# Patient Record
Sex: Female | Born: 1960
Health system: Southern US, Community
[De-identification: ages and names within clinical notes are randomized; demographics above are authoritative.]

## PROBLEM LIST (undated history)

## (undated) DIAGNOSIS — F988 Other specified behavioral and emotional disorders with onset usually occurring in childhood and adolescence: Secondary | ICD-10-CM

## (undated) DIAGNOSIS — G2581 Restless legs syndrome: Secondary | ICD-10-CM

## (undated) DIAGNOSIS — F5104 Psychophysiologic insomnia: Secondary | ICD-10-CM

## (undated) DIAGNOSIS — K219 Gastro-esophageal reflux disease without esophagitis: Secondary | ICD-10-CM

## (undated) DIAGNOSIS — R55 Syncope and collapse: Secondary | ICD-10-CM

## (undated) DIAGNOSIS — F32A Depression, unspecified: Secondary | ICD-10-CM

## (undated) DIAGNOSIS — E785 Hyperlipidemia, unspecified: Secondary | ICD-10-CM

## (undated) DIAGNOSIS — F329 Major depressive disorder, single episode, unspecified: Secondary | ICD-10-CM

## (undated) DIAGNOSIS — R109 Unspecified abdominal pain: Secondary | ICD-10-CM

## (undated) DIAGNOSIS — R197 Diarrhea, unspecified: Secondary | ICD-10-CM

## (undated) DIAGNOSIS — C801 Malignant (primary) neoplasm, unspecified: Secondary | ICD-10-CM

## (undated) DIAGNOSIS — I879 Disorder of vein, unspecified: Secondary | ICD-10-CM

## (undated) HISTORY — DX: Gastro-esophageal reflux disease without esophagitis: K21.9

## (undated) HISTORY — DX: Hyperlipidemia, unspecified: E78.5

## (undated) HISTORY — DX: Psychophysiologic insomnia: F51.04

## (undated) HISTORY — DX: Restless legs syndrome: G25.81

## (undated) HISTORY — DX: Malignant (primary) neoplasm, unspecified: C80.1

## (undated) HISTORY — DX: Depression, unspecified: F32.A

## (undated) HISTORY — DX: Major depressive disorder, single episode, unspecified: F32.9

## (undated) HISTORY — DX: Syncope and collapse: R55

## (undated) HISTORY — DX: Disorder of vein, unspecified: I87.9

## (undated) HISTORY — DX: Other specified behavioral and emotional disorders with onset usually occurring in childhood and adolescence: F98.8

## (undated) HISTORY — PX: APPENDECTOMY: SHX54

## (undated) HISTORY — DX: Diarrhea, unspecified: R19.7

## (undated) HISTORY — DX: Unspecified abdominal pain: R10.9

---

## 1998-02-02 ENCOUNTER — Other Ambulatory Visit: Admission: RE | Admit: 1998-02-02 | Discharge: 1998-02-02 | Payer: Self-pay | Admitting: *Deleted

## 1998-08-29 ENCOUNTER — Inpatient Hospital Stay (HOSPITAL_COMMUNITY): Admission: AD | Admit: 1998-08-29 | Discharge: 1998-09-01 | Payer: Self-pay | Admitting: Obstetrics and Gynecology

## 1998-10-06 ENCOUNTER — Other Ambulatory Visit: Admission: RE | Admit: 1998-10-06 | Discharge: 1998-10-06 | Payer: Self-pay | Admitting: Obstetrics and Gynecology

## 2010-11-10 ENCOUNTER — Inpatient Hospital Stay (HOSPITAL_COMMUNITY)
Admission: AD | Admit: 2010-11-10 | Discharge: 2010-11-15 | DRG: 371 | Disposition: A | Discharge status: Home / Self Care | Payer: Medicaid Other | Source: Other Acute Inpatient Hospital | Attending: Vascular Surgery | Admitting: Vascular Surgery

## 2010-11-10 DIAGNOSIS — T8140XA Infection following a procedure, unspecified, initial encounter: Secondary | ICD-10-CM | POA: Diagnosis present

## 2010-11-10 DIAGNOSIS — Y836 Removal of other organ (partial) (total) as the cause of abnormal reaction of the patient, or of later complication, without mention of misadventure at the time of the procedure: Secondary | ICD-10-CM | POA: Diagnosis present

## 2010-11-10 DIAGNOSIS — K651 Peritoneal abscess: Secondary | ICD-10-CM

## 2010-11-10 DIAGNOSIS — R109 Unspecified abdominal pain: Secondary | ICD-10-CM

## 2010-11-10 DIAGNOSIS — F3289 Other specified depressive episodes: Secondary | ICD-10-CM | POA: Diagnosis present

## 2010-11-10 DIAGNOSIS — F329 Major depressive disorder, single episode, unspecified: Secondary | ICD-10-CM | POA: Diagnosis present

## 2010-11-10 DIAGNOSIS — K55059 Acute (reversible) ischemia of intestine, part and extent unspecified: Secondary | ICD-10-CM | POA: Diagnosis present

## 2010-11-10 DIAGNOSIS — K219 Gastro-esophageal reflux disease without esophagitis: Secondary | ICD-10-CM | POA: Diagnosis present

## 2010-11-10 DIAGNOSIS — E669 Obesity, unspecified: Secondary | ICD-10-CM | POA: Diagnosis present

## 2010-11-10 DIAGNOSIS — I748 Embolism and thrombosis of other arteries: Secondary | ICD-10-CM

## 2010-11-11 ENCOUNTER — Inpatient Hospital Stay (HOSPITAL_COMMUNITY): Payer: Medicaid Other

## 2010-11-11 DIAGNOSIS — K551 Chronic vascular disorders of intestine: Secondary | ICD-10-CM

## 2010-11-11 LAB — BASIC METABOLIC PANEL
BUN: 8 mg/dL (ref 6–23)
CO2: 26 mEq/L (ref 19–32)
Calcium: 8 mg/dL — ABNORMAL LOW (ref 8.4–10.5)
Chloride: 102 mEq/L (ref 96–112)
Creatinine, Ser: 0.67 mg/dL (ref 0.50–1.10)
GFR calc Af Amer: >60 mL/min (ref >60)
GFR calc non Af Amer: >60 mL/min (ref >60)
Glucose, Bld: 97 mg/dL (ref 70–99)
Potassium: 3.9 mEq/L (ref 3.5–5.1)
Sodium: 134 mEq/L — ABNORMAL LOW (ref 135–145)

## 2010-11-11 LAB — HOMOCYSTEINE: Homocysteine: 5.8 umol/L (ref 4.0–15.4)

## 2010-11-11 LAB — LACTIC ACID, PLASMA: Lactic Acid, Venous: 1.2 mmol/L (ref 0.5–2.2)

## 2010-11-11 LAB — APTT: aPTT: 58 seconds — ABNORMAL HIGH (ref 24–37)

## 2010-11-11 LAB — CBC
MCH: 30.4 pg (ref 26.0–34.0)
MCV: 89.2 fL (ref 78.0–100.0)
Platelets: 240 10*3/uL (ref 150–400)
RBC: 3.81 MIL/uL — ABNORMAL LOW (ref 3.87–5.11)
RDW: 14.5 % (ref 11.5–15.5)
WBC: 11.7 10*3/uL — ABNORMAL HIGH (ref 4.0–10.5)

## 2010-11-11 LAB — HEPARIN LEVEL (UNFRACTIONATED): Heparin Unfractionated: 0.22 IU/mL — ABNORMAL LOW (ref 0.30–0.70)

## 2010-11-11 LAB — PROTIME-INR
INR: 0.99 (ref 0.00–1.49)
Prothrombin Time: 13.3 seconds (ref 11.6–15.2)

## 2010-11-12 DIAGNOSIS — K551 Chronic vascular disorders of intestine: Secondary | ICD-10-CM

## 2010-11-12 LAB — CBC
HCT: 33.3 % — ABNORMAL LOW (ref 36.0–46.0)
Hemoglobin: 11.1 g/dL — ABNORMAL LOW (ref 12.0–15.0)
MCV: 90.2 fL (ref 78.0–100.0)
RBC: 3.69 MIL/uL — ABNORMAL LOW (ref 3.87–5.11)
WBC: 9.1 10*3/uL (ref 4.0–10.5)

## 2010-11-12 LAB — HEPARIN LEVEL (UNFRACTIONATED): Heparin Unfractionated: 0.26 IU/mL — ABNORMAL LOW (ref 0.30–0.70)

## 2010-11-12 LAB — BASIC METABOLIC PANEL
BUN: 6 mg/dL (ref 6–23)
CO2: 26 mEq/L (ref 19–32)
Chloride: 101 mEq/L (ref 96–112)
Creatinine, Ser: 0.67 mg/dL (ref 0.50–1.10)
GFR calc Af Amer: >60 mL/min (ref >60)
Glucose, Bld: 92 mg/dL (ref 70–99)
Potassium: 3.9 mEq/L (ref 3.5–5.1)

## 2010-11-12 LAB — LUPUS ANTICOAGULANT PANEL
DRVVT: 43 secs (ref 36.2–44.3)
Lupus Anticoagulant: DETECTED — AB
PTTLA 4:1 Mix: 51.8 secs — ABNORMAL HIGH (ref 30.0–45.6)

## 2010-11-12 LAB — PROTEIN S, TOTAL: Protein S Ag, Total: 95 % (ref 60–150)

## 2010-11-12 LAB — PROTEIN C ACTIVITY: Protein C Activity: 116 % (ref 75–133)

## 2010-11-13 DIAGNOSIS — K551 Chronic vascular disorders of intestine: Secondary | ICD-10-CM

## 2010-11-13 LAB — CBC
Hemoglobin: 10.9 g/dL — ABNORMAL LOW (ref 12.0–15.0)
MCH: 29.9 pg (ref 26.0–34.0)
MCHC: 33.3 g/dL (ref 30.0–36.0)
MCV: 89.8 fL (ref 78.0–100.0)
Platelets: 242 10*3/uL (ref 150–400)
RBC: 3.64 MIL/uL — ABNORMAL LOW (ref 3.87–5.11)

## 2010-11-13 LAB — HEPARIN LEVEL (UNFRACTIONATED): Heparin Unfractionated: 0.28 IU/mL — ABNORMAL LOW (ref 0.30–0.70)

## 2010-11-13 LAB — PROTIME-INR: Prothrombin Time: 13.5 seconds (ref 11.6–15.2)

## 2010-11-14 DIAGNOSIS — K551 Chronic vascular disorders of intestine: Secondary | ICD-10-CM

## 2010-11-14 LAB — CBC
Hemoglobin: 11.4 g/dL — ABNORMAL LOW (ref 12.0–15.0)
MCH: 29.5 pg (ref 26.0–34.0)
MCHC: 33.2 g/dL (ref 30.0–36.0)
MCV: 88.6 fL (ref 78.0–100.0)
Platelets: 287 10*3/uL (ref 150–400)
RBC: 3.87 MIL/uL (ref 3.87–5.11)

## 2010-11-14 LAB — HEPARIN LEVEL (UNFRACTIONATED): Heparin Unfractionated: 0.3 IU/mL (ref 0.30–0.70)

## 2010-11-15 DIAGNOSIS — K551 Chronic vascular disorders of intestine: Secondary | ICD-10-CM

## 2010-11-15 LAB — CBC
Hemoglobin: 12.1 g/dL (ref 12.0–15.0)
MCH: 29.8 pg (ref 26.0–34.0)
Platelets: 299 10*3/uL (ref 150–400)
RBC: 4.06 MIL/uL (ref 3.87–5.11)
WBC: 6.3 10*3/uL (ref 4.0–10.5)

## 2010-11-15 LAB — CULTURE, ROUTINE-ABSCESS: Culture: NO GROWTH

## 2010-11-15 LAB — PROTIME-INR: Prothrombin Time: 26.4 seconds — ABNORMAL HIGH (ref 11.6–15.2)

## 2010-11-15 LAB — HEPARIN LEVEL (UNFRACTIONATED): Heparin Unfractionated: 0.39 IU/mL (ref 0.30–0.70)

## 2010-11-15 LAB — PROTHROMBIN GENE MUTATION

## 2010-11-16 LAB — CARDIOLIPIN ANTIBODIES, IGG, IGM, IGA: Anticardiolipin IgG: 7 GPL U/mL — ABNORMAL LOW (ref <23)

## 2010-11-16 LAB — BETA-2-GLYCOPROTEIN I ABS, IGG/M/A
Beta-2-Glycoprotein I IgA: 2 A Units (ref <20)
Beta-2-Glycoprotein I IgM: 2 M Units (ref <20)

## 2010-11-16 NOTE — Consult Note (Signed)
Rebecca Caldwell, Rebecca Caldwell NO.:  [id number]  MEDICAL RECORD NO.:  [id number]  LOCATION:  2311                         FACILITY:  [facility]  PHYSICIAN:  Abigail Miyamoto, M.D. DATE OF BIRTH:  [birth year]  DATE OF CONSULTATION:  11/10/2010 DATE OF DISCHARGE:                                CONSULTATION   CHIEF COMPLAINT:  Abdominal pain.  REFERRING PHYSICIAN:  Janetta Hora. Fields, MD  HISTORY:  This is a 50 year old female who is status post a laparoscopic appendectomy performed in [place] at [place] approximately 12 days ago.  She now started having increasing abdominal pain and pain in her back and presented to [facility].  At [place], a CAT scan of her abdomen and pelvis was performed which showed a fluid collection of right lower quadrant as well as a superior mesenteric vein thrombosis. She is being admitted by Vascular Surgery for anticoagulation here at [facility].  I have been asked to see her, address her fluid collection as well as to evaluate for any evidence of ischemic bowel.  She has had no nausea or vomiting.  She reports she had normal bowel movement today. She denies any fevers or chills.  Her pain is moderate and again crampy in nature.  PAST MEDICAL HISTORY:  Negative.  PAST SURGICAL HISTORY:  Above-mentioned appendectomy as well as C- section.  MEDICATIONS:  Please see universal medication form.  ALLERGIES:  No known drug allergies.  SOCIAL HISTORY:  She does not smoke, does not drink alcohol.  FAMILY HISTORY:  Negative for clotting disorders and is positive for diabetes, hypertension, lymphoma, and thyroid problems.  REVIEW OF SYSTEMS:  GENERAL:  Negative fever or chills.  PULMONARY: Negative for cough, shortness or breath, or difficulty breathing. CARDIAC:  Negative for chest pain or irregular heartbeat.  GI:  Listed as above.  URINARY:  Negative for dysuria or hematuria.  The rest of review of systems including skin, eyes, ears,  nose, and throat, musculoskeletal, neurologic, psychiatric, endocrine are normal.  PHYSICAL EXAMINATION:  GENERAL:  Well-developed, well-nourished female who is mildly uncomfortable, but in no acute distress. VITAL SIGNS:  Heart rate 78, respiratory rate 13, blood pressure 124/68, she is sating 97% on room air, she is afebrile. HEENT:  Eyes, anicteric.  Pupils are reactive bilaterally.  ENT, external ears and nose are normal.  Hearing is normal.  Oropharynx is clear. NECK:  Supple.  Trachea is midline.  There is no thyromegaly. LUNGS:  Clear to auscultation bilaterally with normal respiratory effort. CARDIOVASCULAR:  Regular rate and rhythm.  There are no murmurs.  There is no peripheral edema. ABDOMEN:  Soft.  There is mild diffuse tenderness with no frank peritonitis.  Her incisions are well healed.  There are no palpable masses.  There is no organomegaly. EXTREMITIES:  Warm and well perfused.  No edema, clubbing, or cyanosis. Peripheral pulses are intact to all 4 extremities. SKIN:  No rash and no jaundice. PSYCHIATRIC:  Awake and alert and oriented.  Judgment and affect appear normal.  DATA REVIEWED:  I reviewed the CAT scan of the abdomen and pelvis that shows a 4-cm collection with air in it in the right lower quadrant in  the retrocecal location.  There is also SMV thrombosis.  There is no bowel wall thickening.  There is no free air and no free fluid. Laboratory data from [place] shows her to have an elevated white blood count of 11.1.  There was a slight left shift.  IMPRESSION:  This is a 50 year old female with a postoperative intra- abdominal abscess after a laparoscopic appendectomy as well as superior mesenteric vein thrombosis.  Dr. Darrick Penna said that we will start [name] on IV heparin regarding thrombosis.  Currently, there is no evidence of bowel ischemia.  We will follow up with her abdominal exam closely.  Regarding the collection in the right lower quadrant,  I do suspect this is an abscess after discussion with the radiologist.  We will be scheduling her to get a consultation from interventional radiologist for a percutaneous drainage of this collection.  I am going to also start her on IV antibiotics.  We will follow her closely with you.     Abigail Miyamoto, M.D.     DB/MEDQ  D:  11/10/2010  T:  11/11/2010  Job:  [id number]  Electronically Signed by Abigail Miyamoto M.D. on 11/16/2010 03:36:10 PM

## 2010-11-24 ENCOUNTER — Ambulatory Visit (INDEPENDENT_AMBULATORY_CARE_PROVIDER_SITE_OTHER): Payer: Self-pay | Admitting: Surgery

## 2010-11-24 ENCOUNTER — Encounter (INDEPENDENT_AMBULATORY_CARE_PROVIDER_SITE_OTHER): Payer: Self-pay | Admitting: Surgery

## 2010-11-25 ENCOUNTER — Encounter (INDEPENDENT_AMBULATORY_CARE_PROVIDER_SITE_OTHER): Payer: Self-pay | Admitting: Surgery

## 2010-11-25 ENCOUNTER — Ambulatory Visit (INDEPENDENT_AMBULATORY_CARE_PROVIDER_SITE_OTHER): Payer: Self-pay | Admitting: Surgery

## 2010-11-25 DIAGNOSIS — K55059 Acute (reversible) ischemia of intestine, part and extent unspecified: Secondary | ICD-10-CM

## 2010-11-25 DIAGNOSIS — K651 Peritoneal abscess: Secondary | ICD-10-CM

## 2010-11-25 DIAGNOSIS — T8143XA Infection following a procedure, organ and space surgical site, initial encounter: Secondary | ICD-10-CM | POA: Insufficient documentation

## 2010-11-25 DIAGNOSIS — Z9049 Acquired absence of other specified parts of digestive tract: Secondary | ICD-10-CM | POA: Insufficient documentation

## 2010-11-25 DIAGNOSIS — K55069 Acute infarction of intestine, part and extent unspecified: Secondary | ICD-10-CM | POA: Insufficient documentation

## 2010-11-25 DIAGNOSIS — T8140XA Infection following a procedure, unspecified, initial encounter: Secondary | ICD-10-CM

## 2010-11-25 DIAGNOSIS — Z9889 Other specified postprocedural states: Secondary | ICD-10-CM

## 2010-11-25 NOTE — Progress Notes (Signed)
Subjective:     Patient ID: Rebecca Caldwell, female   DOB: July 12, 1960, 50 y.o.   MRN: [id number]    BP 123/70  Pulse 61  Temp(Src) 97.9 F (36.6 C) (Temporal)  Ht 5\' 7"  (1.702 m)  Wt 224 lb 3.2 oz (101.696 kg)  BMI 35.11 kg/m2    HPI  This is a 50 year old female who had a left septic appendectomy performed in [place]. She presented here several weeks postoperatively with abdominal pain. She was found to have a superior mesenteric vein thrombosis as well as an intra-abdominal abscess. The abscess was drained and then she was placed on Coumadin. She has done well until this past weekend when she developed abdominal pain and loose bowel movements. This has since resolved. Currently she feels well. Her primary care physician is following her INR. Review of Systems     Objective:   Physical Exam On exam today, she is well in appearance. Her abdomen is soft and nontender. There are no masses. Her incisions are well healed.    Assessment:    Patient status post left optic appendectomy E. by intra-abdominal abscess and superior mesenteric vein thrombosis.     Plan:     For now, we will follow her expectantly. If she continues to have persistent diarrhea we will check a stool for C. difficile. She will have to be on Coumadin for 6 months per the vascular surgeons. I will see her back in 3 weeks. She will call should she develop recurrent abdominal pain.

## 2010-11-29 NOTE — Discharge Summary (Signed)
NAMESHANIAH, Rebecca Caldwell                ACCOUNT NO.:  [id number]  MEDICAL RECORD NO.:  [id number]  LOCATION:  2020                         FACILITY:  [facility]  PHYSICIAN:  Janetta Hora. Keeana Pieratt, MD  DATE OF BIRTH:  [birth year]  DATE OF ADMISSION:  11/10/2010 DATE OF DISCHARGE:  11/15/2010                              DISCHARGE SUMMARY   ADMISSION DIAGNOSIS:  Superior mesenteric vein thrombosis.  HISTORY OF PRESENT ILLNESS:  This is a 50 year old female who was referred from [facility] emergency department with a diagnosis of superior mesenteric vein thrombosis.  The patient recently had an appendectomy in [place] approximately 2 weeks ago.  She apparently had a gangrenous appendix at that time.  The pain was fairly slow in onset yesterday and was epigastric in nature with some cramping and radiation to the back.  The pain has worsened over the course of that day.  CT scan at [facility] revealed a superior mesenteric vein thrombosis as well as a right lower quadrant fluid collection which was a few centimeters in size.  There was no bowel edema.  The patient was transferred from [facility] for further treatment here at [facility].  HOSPITAL COURSE:  The patient was admitted to the hospital and placed on heparin drip.  A General Surgery consult was also obtained.  General Surgery consulted Interventional Radiology for placement of percutaneous drain for abscess.  She was also started on IV antibiotics.  She tolerated this well.  She was started on Coumadin and INR was therapeutic at discharge at 2.4.  She is also scheduled to have her drain removed today before discharge.  The rest of her hospital course included increasing ambulation as well as increasing her diet without difficulty.  DISCHARGE DIAGNOSES: 1. Superior mesenteric vein thrombosis. 2. Status post laparoscopic appendectomy for gangrenous appendix. 3. Percutaneous abdominal drain placement. 4.  Gastroesophageal reflux disease. 5. Depression. 6. Umbilical hernia in the past.  DISCHARGE MEDICATIONS: 1. Cipro 500 mg p.o. b.i.d. for 1 week. 2. Coumadin 5 mg p.o. daily.     a.     The patient started on November 16, 2010.     b.     Dose may vary pending INR. 3. Flagyl 500 mg p.o. t.i.d. x1 week. 4. Celexa 20 mg p.o. at bedtime. 5. Colace 1 tablet by mouth daily p.r.n. 6. Pepcid 1 tablet p.o. daily. 7. Sumatriptan 50 mg 1-2 tablets daily p.r.n.  DISCHARGE INSTRUCTIONS:  The patient is discharged home with extensive instructions on wound care and progressive ambulation.  She is instructed to resume her Coumadin on November 16, 2010, as she has got 7.5 mg of Coumadin for 3 days and her INR is going from 1-1.5 to 2.4.  I discussed this with the Pharmacy and we will discharge her home on 5 mg of Coumadin starting November 16, 2010.  FOLLOWUP: 1. The patient is to follow up with Dr. Magnus Ivan in 1 week. 2. The patient is to follow up in Dr. Abner Greenspan office on Thursday,     November 18, 2010 with an INR check.  Dr. Yetta Flock will also manage her     Coumadin.     Lelon Mast  [place], [place]   ______________________________ Janetta Hora Alisea Matte, MD    SE/MEDQ  D:  11/15/2010  T:  11/15/2010  Job:  [id number]  cc:   Abigail Miyamoto, M.D. Abner Greenspan, M.D.  Electronically Signed by Newton Pigg PA on 11/16/2010 10:29:25 AM Electronically Signed by Fabienne Bruns MD on 11/29/2010 09:48:40 AM

## 2010-11-29 NOTE — H&P (Signed)
Rebecca Caldwell, MASK                ACCOUNT NO.:  [id number]  MEDICAL RECORD NO.:  [id number]  LOCATION:  2311                         FACILITY:  [facility]  PHYSICIAN:  Rebecca Hora. Julianny Milstein, MD  DATE OF BIRTH:  May 11, 1961  DATE OF ADMISSION:  11/10/2010 DATE OF DISCHARGE:                             HISTORY & PHYSICAL   CHIEF COMPLAINT:  Abdominal pain.  HISTORY OF PRESENT ILLNESS:  The patient is a 50 year old female referred from [facility] emergency room with a diagnosis of superior mesenteric vein thrombosis.  The patient recently had an appendectomy in [place], approximately 2 weeks ago.  She apparently had a gangrenous appendix at that time.  The pain was fairly slow in onset yesterday and was epigastric in nature with some cramping and radiation to the back.  The pain is worsened over the course of the day today.  CT scan at [facility] showed superior mesenteric vein thrombosis as well as a right lower quadrant fluid collection which was a few centimeters in size.  There is no bowel wall edema.  The patient was transferred from [facility] emergency room for further treatment here at [facility].  PAST MEDICAL HISTORY: 1. Reflux disease. 2. Depression. 3. Umbilical hernia.  PAST SURGICAL HISTORY:  As above, C-section.  MEDICATIONS:  Celexa 20 mg once a day.  ALLERGIES:  PENICILLIN causes hives, PERCOCET causes itching, and NONSTEROIDALS cause hives.  SOCIAL HISTORY:  No tobacco or alcohol.  FAMILY HISTORY:  No history of hypercoagulable state.  REVIEW OF SYSTEMS:  Full 12-point review of systems was performed on the patient.  These were all negative except for a scar on her right leg from previous baseball injury.  PHYSICAL EXAMINATION:  VITAL SIGNS:  Temperature 98.3, pulse 87, respirations 18, blood pressure 142/74, and oxygen saturations 97% on room air. GENERAL:  She is a white female.  She is mildly uncomfortable.  She is alert and oriented  x3. HEENT:  Unremarkable. NECK:  2+ carotid pulses. CHEST:  Clear to auscultation. CARDIAC:  Regular rate and rhythm. ABDOMEN:  Obese, soft, mild diffuse tenderness, and no masses. EXTREMITIES:  No significant edema.  2+ DP pulses bilaterally. NEURO:  Upper extremity and lower extremity strength is 5/5 and symmetric.  LABORATORY DATA:  Laboratory from [facility], white blood cell count 11.1, hemoglobin 12.1, and platelet count 271.  Creatinine 0.64. AST 18, ALT 18, alkaline phosphatase 98, albumin 3.0.  UA:  Trace leukocyte esterase, few bacteria, and 5-10 white cells.  CT scan of the abdomen and pelvis was reviewed.  It shows a small fluid collection in the right lower quadrant as well as a ring enhancing superior mesenteric vein consistent with thrombosis over a few centimeters at the junction of the portal vein.  PLAN:  IV hydration, keep n.p.o. for now with serial abdominal exams, continue heparin drip with bridging the Coumadin after her right lower quadrant fluid collection is resolved, and percutaneous drainage in interventional radiology of her abscess as outlined by Dr. Magnus Ivan. CBC, BMET, and  lactate in the morning.  We will hold her heparin temporarily for her drainage placement.  She is currently on broad- spectrum antibiotics which should cover  her for the possible abscess of right lower quadrant as well as a possible mild urinary tract infection based on her laboratories.     Rebecca Hora. Tinslee Klare, MD     CEF/MEDQ  D:  11/10/2010  T:  11/10/2010  Job:  [id number]  Electronically Signed by Rebecca Bruns MD on 11/29/2010 09:48:52 AM

## 2010-12-16 ENCOUNTER — Ambulatory Visit (INDEPENDENT_AMBULATORY_CARE_PROVIDER_SITE_OTHER): Payer: Self-pay | Admitting: Surgery

## 2010-12-16 ENCOUNTER — Encounter (INDEPENDENT_AMBULATORY_CARE_PROVIDER_SITE_OTHER): Payer: Self-pay | Admitting: Surgery

## 2010-12-16 VITALS — Temp 95.7°F

## 2010-12-16 DIAGNOSIS — I81 Portal vein thrombosis: Secondary | ICD-10-CM

## 2010-12-16 DIAGNOSIS — Z9049 Acquired absence of other specified parts of digestive tract: Secondary | ICD-10-CM

## 2010-12-16 DIAGNOSIS — K55069 Acute infarction of intestine, part and extent unspecified: Secondary | ICD-10-CM

## 2010-12-16 DIAGNOSIS — Z9889 Other specified postprocedural states: Secondary | ICD-10-CM

## 2010-12-16 NOTE — Progress Notes (Signed)
Subjective:     Patient ID: Rebecca Caldwell, female   DOB: 12-May-1961, 50 y.o.   MRN: [id number]  HPI She is here for another followup visit status post laparoscopic appendectomy performed in [place]. Her postoperative course was palpated by abscess formation and SMV thrombosis. She still has intermittent crampy abdominal pain around her umbilicus when she eats. Her diarrhea has resolved.  Review of Systems     Objective:   Physical Exam On physical examination, her abdomen is soft there is minimal tenderness in the lower abdomen with almost no guarding. There is no right upper quadrant tenderness    Assessment:     Patient status post laparoscopic appendectomy as well as SMV thrombosis on Coumadin.    Plan:    She will continue the Coumadin. I will see her back in one month for recheck.

## 2011-01-21 ENCOUNTER — Encounter (INDEPENDENT_AMBULATORY_CARE_PROVIDER_SITE_OTHER): Payer: Self-pay | Admitting: Surgery

## 2011-08-23 IMAGING — CT CT ABCESS DRAINAGE
1 of 2 series · 13 of 32 positions shown, 19 images · non-contrast
Comparison: none

CLINICAL HISTORY: Post appendectomy with right lower quadrant fluid
collection.

[Series 2: routine abdomen · axial · 0.92mm/px · z∈[-368,-198]mm · 13 of 40 slices shown, 19 images]
[im 3/40  soft-tissue]
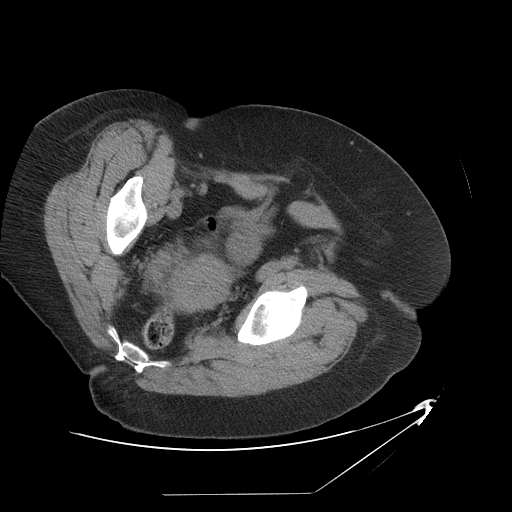
[im 3/40  bone]
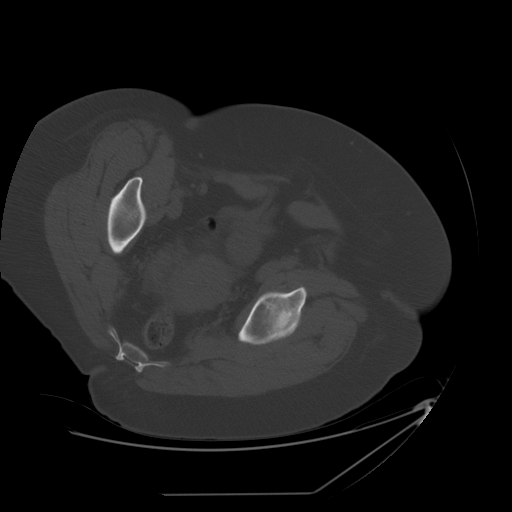
[im 6/40  soft-tissue]
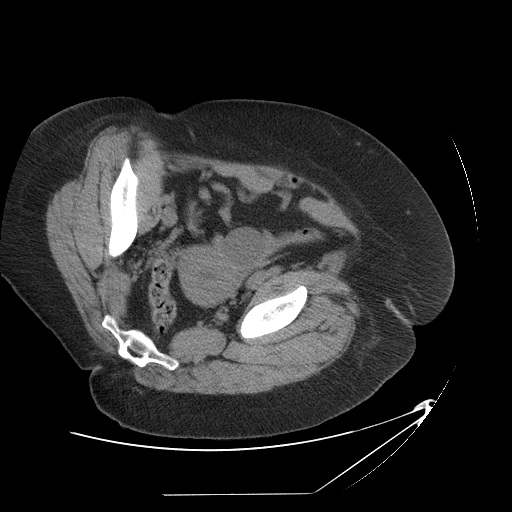
[im 9/40  soft-tissue]
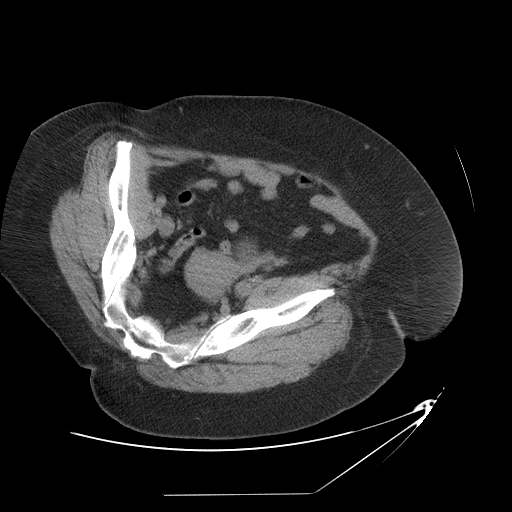
[im 12/40  soft-tissue]
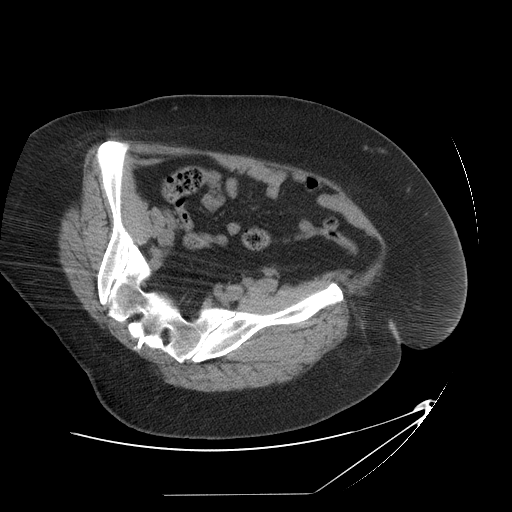
[im 14/40  soft-tissue]
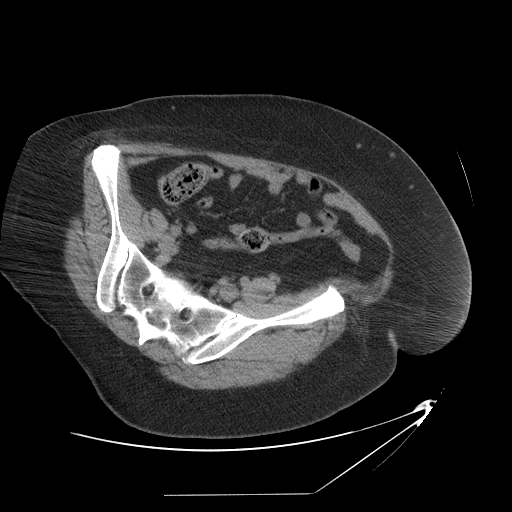
[im 17/40  soft-tissue]
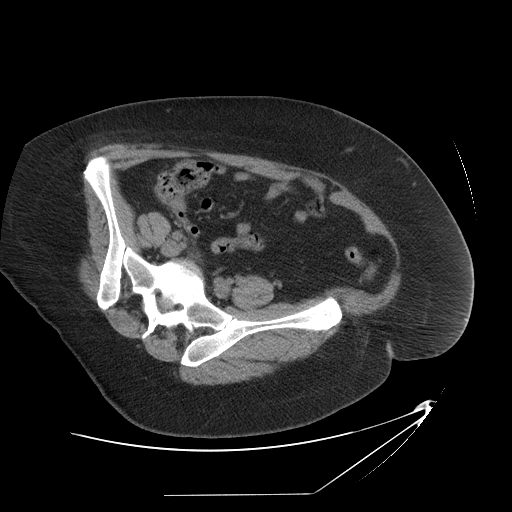
[im 20/40  soft-tissue]
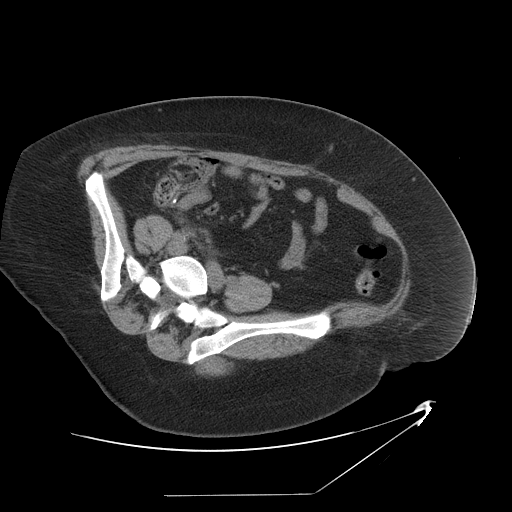
[im 23/40  soft-tissue]
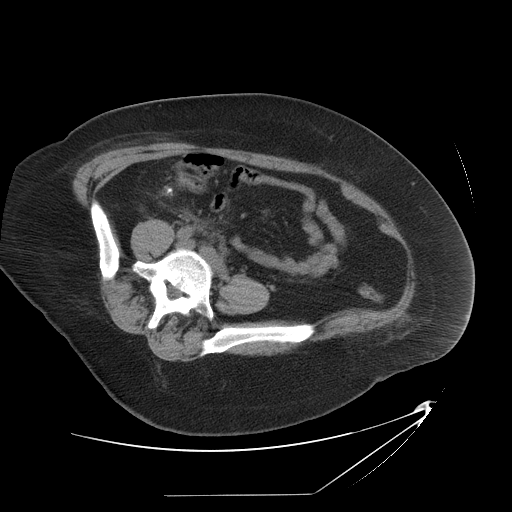
[im 26/40  soft-tissue]
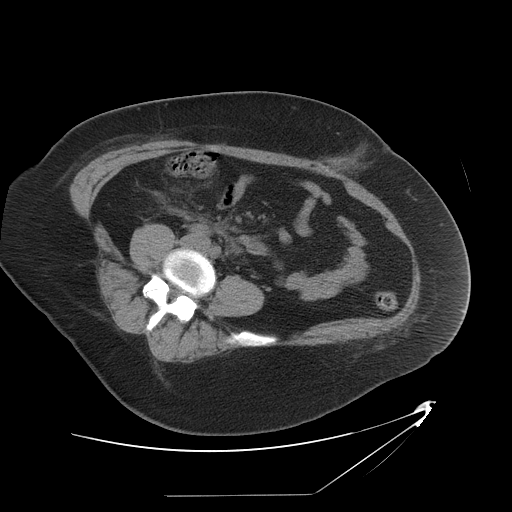
[im 26/40  bone]
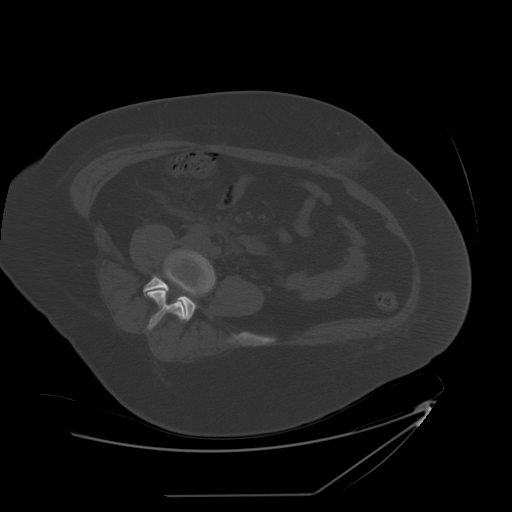
[im 28/40  soft-tissue]
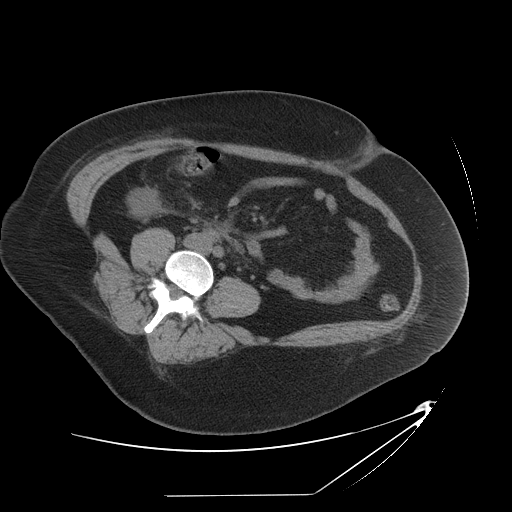
[im 28/40  lung]
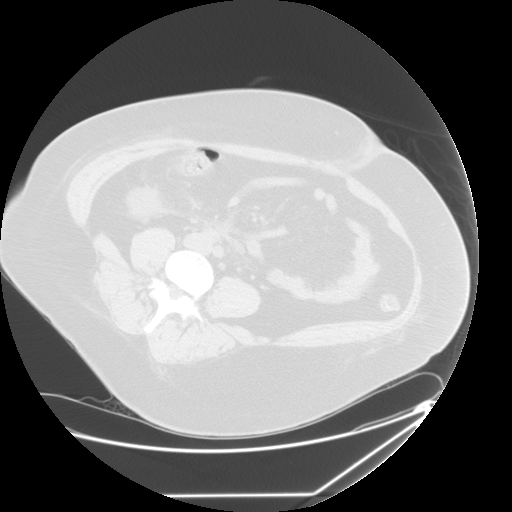
[im 31/40  soft-tissue]
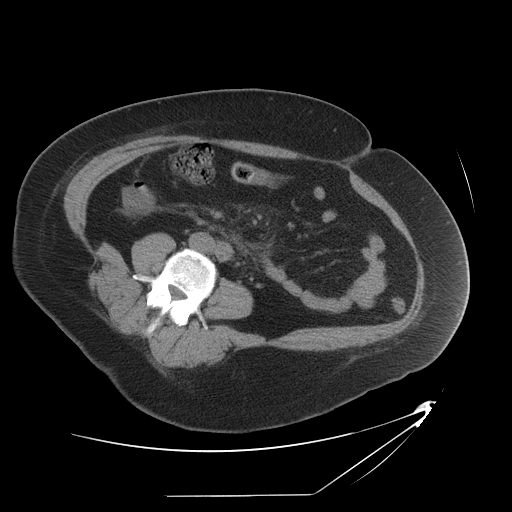
[im 31/40  lung]
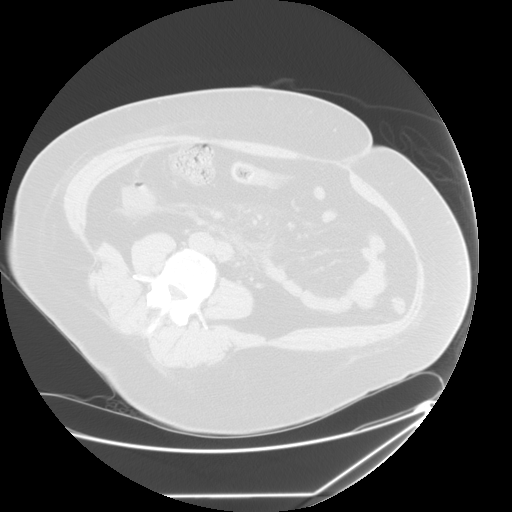
[im 34/40  soft-tissue]
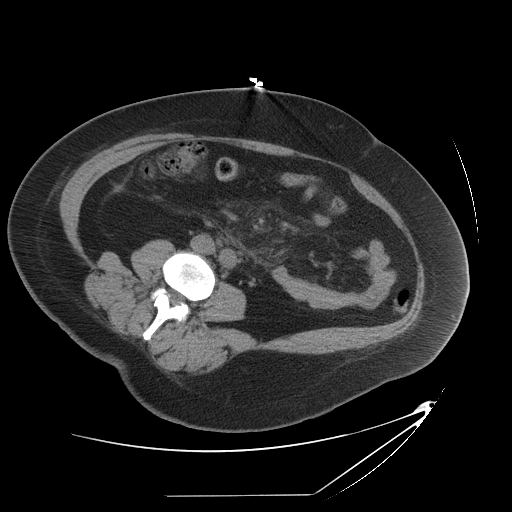
[im 34/40  lung]
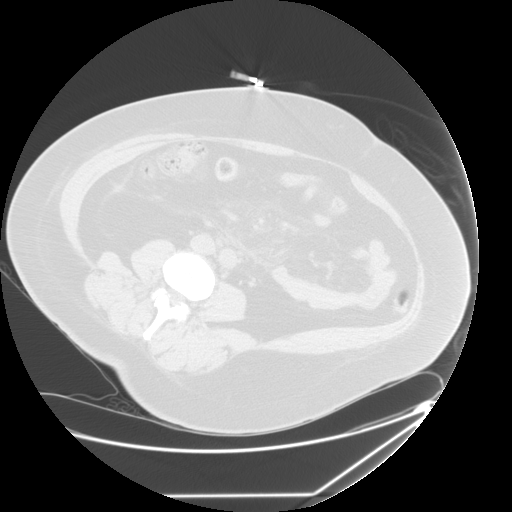
[im 37/40  soft-tissue]
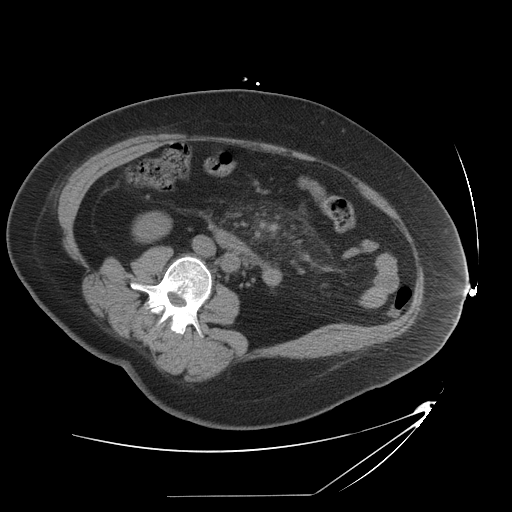
[im 37/40  lung]
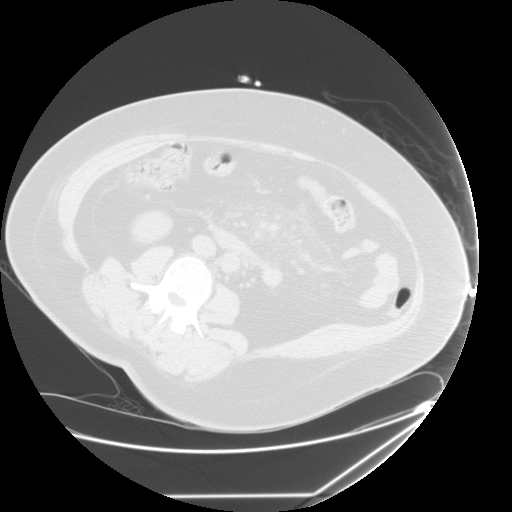

[13 of 32 positions shown; findings below may reference images not displayed]

PROCEDURE(S): CT GUIDED DRAIN PLACEMENT IN ABDOMINAL FLUID
COLLECTION

Medications:Versed 3 mg, Fentanyl 150 mcg. A radiology nurse
monitored the patient for moderate sedation.

Moderate sedation time:23 minutes

Procedure:The procedure was explained to the patient.  The risks
and benefits of the procedure were discussed and the patient's
questions were addressed.  Informed consent was obtained from the
patient.  The patient was placed supine in the CT scanner.  The
right side was slightly elevated.  Images through the lower abdomen
were obtained.  The low density fluid collection in the right lower
abdomen was targeted.  The right lower abdomen was prepped and
draped in a sterile fashion.  Skin was anesthetized with lidocaine.
18 gauge needle was directed into the collection with CT guidance.
Brown fluid was aspirated.  A stiff Amplatz wire was placed and a
10-French all-purpose drain was placed.  The 12 ml of brown fluid
was aspirated.  The entire collection was aspirated.  Catheter
sutured to the skin.  The catheter was attached to a suction bulb.
FINDINGS: Fluid collection in the right lower quadrant of the
abdomen with a small amount of gas.  The majority of the fluid was
aspirated following placement of the 10-French drain.  The brown
fluid was sent for Gram stain and culture.

Complications: None
IMPRESSION: Placement of a drainage catheter in the right lower
quadrant fluid collection.  Sample sent for Gram stain and culture.

## 2015-05-17 HISTORY — PX: OTHER SURGICAL HISTORY: SHX169

## 2015-09-28 ENCOUNTER — Ambulatory Visit: Payer: Self-pay | Admitting: Physical Therapy

## 2016-03-01 DIAGNOSIS — C4371 Malignant melanoma of right lower limb, including hip: Secondary | ICD-10-CM | POA: Insufficient documentation

## 2016-03-01 DIAGNOSIS — Z6834 Body mass index (BMI) 34.0-34.9, adult: Secondary | ICD-10-CM | POA: Insufficient documentation

## 2016-03-22 DIAGNOSIS — Z09 Encounter for follow-up examination after completed treatment for conditions other than malignant neoplasm: Secondary | ICD-10-CM | POA: Insufficient documentation

## 2017-11-17 ENCOUNTER — Other Ambulatory Visit: Payer: Self-pay | Admitting: Internal Medicine

## 2017-11-17 DIAGNOSIS — R1011 Right upper quadrant pain: Secondary | ICD-10-CM

## 2018-05-07 ENCOUNTER — Encounter: Payer: Self-pay | Admitting: Gastroenterology

## 2018-06-26 DIAGNOSIS — Z0000 Encounter for general adult medical examination without abnormal findings: Secondary | ICD-10-CM | POA: Diagnosis not present

## 2018-06-26 DIAGNOSIS — R82998 Other abnormal findings in urine: Secondary | ICD-10-CM | POA: Diagnosis not present

## 2018-07-04 DIAGNOSIS — Z0000 Encounter for general adult medical examination without abnormal findings: Secondary | ICD-10-CM | POA: Diagnosis not present

## 2018-07-04 DIAGNOSIS — B351 Tinea unguium: Secondary | ICD-10-CM | POA: Diagnosis not present

## 2018-07-04 DIAGNOSIS — M1990 Unspecified osteoarthritis, unspecified site: Secondary | ICD-10-CM | POA: Diagnosis not present

## 2018-07-06 DIAGNOSIS — Z1212 Encounter for screening for malignant neoplasm of rectum: Secondary | ICD-10-CM | POA: Diagnosis not present

## 2018-10-12 ENCOUNTER — Encounter: Payer: Self-pay | Admitting: Podiatry

## 2018-10-12 ENCOUNTER — Other Ambulatory Visit: Payer: Self-pay

## 2018-10-12 ENCOUNTER — Ambulatory Visit: Payer: 59 | Admitting: Podiatry

## 2018-10-12 VITALS — BP 104/74 | HR 80 | Temp 98.1°F | Resp 16

## 2018-10-12 DIAGNOSIS — B351 Tinea unguium: Secondary | ICD-10-CM

## 2018-10-12 MED ORDER — TERBINAFINE HCL 250 MG PO TABS: 250.0000 mg | ORAL_TABLET | Freq: Every day | ORAL | 0 refills | Status: AC

## 2018-10-12 NOTE — Progress Notes (Signed)
   Subjective:    Patient ID: Rebecca Caldwell, female    DOB: Aug 23, 1960, 58 y.o.   MRN: [id number]  HPI    Review of Systems  All other systems reviewed and are negative.      Objective:   Physical Exam        Assessment & Plan:

## 2018-10-13 NOTE — Progress Notes (Signed)
Subjective:   Patient ID: Rebecca Caldwell, female   DOB: 58 y.o.   MRN: [id number]   HPI Patient presents stating she has nail fungus and it is been going on a while and currently not painful but makes it difficult at times for cutting and she would like to be aggressive to try to eliminate the problem.  Patient does not smoke likes to be active   Review of Systems  All other systems reviewed and are negative.       Objective:  Physical Exam Vitals signs and nursing note reviewed.  Constitutional:      Appearance: She is well-developed.  Pulmonary:     Effort: Pulmonary effort is normal.  Musculoskeletal: Normal range of motion.  Skin:    General: Skin is warm.  Neurological:     Mental Status: She is alert.     Neurovascular status found to be intact muscle strength adequate range of motion within normal limits with patient noted to have discoloration of the hallux nails bilateral and fifth nails with yellow type material and no odor noted.  There is also some irritation around the digits themselves consistent with skin infection and patient has good digital perfusion well oriented x3     Assessment:  Mycotic nail infection bilateral with probable trauma also present     Plan:  H&P reviewed conditions.  I do think that we can consider a aggressive conservative approach but I did explain we cannot cure the fact that she does have probable trauma as part of this problem.  Patient wants to undergo a aggressive treatment plan and today I went ahead and I advised on oral antifungal educated her on this along with laser and topical.  She is scheduled for laser therapy and will have approximately 6 treatments for this and we are doing liver function studies for this particular condition with instructions given

## 2018-10-22 DIAGNOSIS — B351 Tinea unguium: Secondary | ICD-10-CM

## 2018-10-23 ENCOUNTER — Other Ambulatory Visit: Payer: Self-pay

## 2018-10-23 ENCOUNTER — Ambulatory Visit (INDEPENDENT_AMBULATORY_CARE_PROVIDER_SITE_OTHER): Payer: 59

## 2018-10-23 DIAGNOSIS — B351 Tinea unguium: Secondary | ICD-10-CM

## 2018-10-23 NOTE — Patient Instructions (Signed)

## 2018-10-29 NOTE — Progress Notes (Signed)
Pt presents with mycotic infection of nails 1-5 bilateral.  All other systems are negative  Laser therapy administered to affected nails and tolerated well. All safety precautions were in place.  2nd treatment.  Follow up in 4 weeks     

## 2018-11-22 ENCOUNTER — Ambulatory Visit: Payer: 59 | Admitting: Podiatry

## 2018-11-22 ENCOUNTER — Other Ambulatory Visit: Payer: Self-pay

## 2018-11-22 DIAGNOSIS — B351 Tinea unguium: Secondary | ICD-10-CM

## 2018-12-24 ENCOUNTER — Ambulatory Visit: Payer: 59

## 2018-12-26 ENCOUNTER — Encounter: Payer: Self-pay | Admitting: Neurology

## 2018-12-27 NOTE — Progress Notes (Signed)
Pt presents with mycotic infection of nails 1-5 bilateral.  All other systems are negative  Laser therapy administered to affected nails and tolerated well. All safety precautions were in place.  3rd treatment.  Follow up in 4 weeks     

## 2019-01-01 ENCOUNTER — Institutional Professional Consult (permissible substitution): Payer: 59 | Admitting: Neurology

## 2019-01-03 ENCOUNTER — Telehealth: Payer: Self-pay | Admitting: Neurology

## 2019-01-03 ENCOUNTER — Institutional Professional Consult (permissible substitution): Payer: 59 | Admitting: Neurology

## 2019-01-03 NOTE — Telephone Encounter (Signed)
Pt was scheduled for sleep consult on 01/01/19. Pt called and rescheduled that appt on 12/31/18 at 3:53 pm. This counts as a no show. Pt was rescheduled for 01/03/19 the pt no showed to that appt. Dr. Brett Fairy would you like to dismiss the pt or do you want pt to be r/s again?

## 2019-01-03 NOTE — Telephone Encounter (Signed)
Dismiss, please

## 2019-01-07 ENCOUNTER — Encounter: Payer: Self-pay | Admitting: Neurology

## 2019-01-26 ENCOUNTER — Other Ambulatory Visit: Payer: Self-pay | Admitting: Podiatry

## 2019-09-10 ENCOUNTER — Other Ambulatory Visit: Payer: Self-pay

## 2019-09-10 ENCOUNTER — Emergency Department (HOSPITAL_BASED_OUTPATIENT_CLINIC_OR_DEPARTMENT_OTHER)
Admission: EM | Admit: 2019-09-10 | Discharge: 2019-09-10 | Disposition: A | Discharge status: Home / Self Care | Payer: 59 | Attending: Emergency Medicine | Admitting: Emergency Medicine

## 2019-09-10 ENCOUNTER — Encounter (HOSPITAL_BASED_OUTPATIENT_CLINIC_OR_DEPARTMENT_OTHER): Payer: Self-pay

## 2019-09-10 ENCOUNTER — Emergency Department (HOSPITAL_BASED_OUTPATIENT_CLINIC_OR_DEPARTMENT_OTHER): Payer: 59

## 2019-09-10 DIAGNOSIS — M5432 Sciatica, left side: Secondary | ICD-10-CM | POA: Insufficient documentation

## 2019-09-10 DIAGNOSIS — M549 Dorsalgia, unspecified: Secondary | ICD-10-CM | POA: Diagnosis present

## 2019-09-10 MED ORDER — ONDANSETRON 4 MG PO TBDP
4.0000 mg | ORAL_TABLET | Freq: Once | ORAL | Status: AC
  Administered 2019-09-10: 4 mg via ORAL
  Filled 2019-09-10: qty 1

## 2019-09-10 MED ORDER — MORPHINE SULFATE (PF) 10 MG/ML IV SOLN
10.0000 mg | Freq: Once | INTRAVENOUS | Status: DC
  Filled 2019-09-10: qty 1

## 2019-09-10 MED ORDER — CYCLOBENZAPRINE HCL 10 MG PO TABS: 5.0000 mg | ORAL_TABLET | Freq: Two times a day (BID) | ORAL | 0 refills | Status: DC | PRN

## 2019-09-10 MED ORDER — MORPHINE SULFATE (PF) 10 MG/ML IV SOLN: 10.0000 mg | Freq: Once | INTRAVENOUS | Status: DC

## 2019-09-10 MED ORDER — MORPHINE SULFATE 15 MG PO TABS: 7.5000 mg | ORAL_TABLET | ORAL | 0 refills | Status: AC | PRN

## 2019-09-10 MED ORDER — MORPHINE SULFATE (PF) 4 MG/ML IV SOLN
10.0000 mg | Freq: Once | INTRAVENOUS | Status: AC
  Administered 2019-09-10: 10 mg via INTRAMUSCULAR
  Filled 2019-09-10: qty 3

## 2019-09-10 MED ORDER — DEXAMETHASONE SODIUM PHOSPHATE 10 MG/ML IJ SOLN
10.0000 mg | Freq: Once | INTRAMUSCULAR | Status: AC
  Administered 2019-09-10: 10 mg via INTRAMUSCULAR
  Filled 2019-09-10: qty 1

## 2019-09-10 NOTE — Discharge Instructions (Addendum)
SEEK IMMEDIATE MEDICAL ATTENTION IF: New numbness, tingling, weakness, or problem with the use of your arms or legs.  Severe back pain not relieved with medications.  Change in bowel or bladder control.  Increasing pain in any areas of the body (such as chest or abdominal pain).  Shortness of breath, dizziness or fainting.  Nausea (feeling sick to your stomach), vomiting, fever, or sweats.  

## 2019-09-10 NOTE — ED Notes (Signed)
Pt is calling for a ride in order to received narcotics

## 2019-09-10 NOTE — ED Notes (Signed)
Pt wheeled out to waiting room, ambulated to car, driven home by husband. Educated about medication sent to pharmacy. Pt verbalizes DC teaching.

## 2019-09-10 NOTE — ED Triage Notes (Addendum)
Pt c/o lower back pain that radiates down left LE x 3 days-denies injury-states she has been sitting with family member in hospital x 3 days-pt crying-to triage in w/c-states she was driven to ED by husband-J Hanes, EMT states pt was seated in car-stood to w/c to be brought into ED WR

## 2019-09-10 NOTE — ED Provider Notes (Signed)
[facility] EMERGENCY DEPARTMENT Provider Note   CSN: [id number] Arrival date & time: 09/10/19  1248     History Chief Complaint  Patient presents with  . Back Pain    Rebecca Caldwell is a 59 y.o. female with a past medical history of ADD, stage I melanoma of the hand, depression, reflux, restless legs who presents emergency department chief complaint of left low back pain.  Patient states that her pain began 2 days ago started on the right side, migrated across her lower back and then localized to the left.  It is radiating down the back of her left thigh but does not cross the knee.  Is worse with movement, change of position.  She denies weakness of the lower extremities, numbness or tingling, saddle anesthesia, loss of bowel or bladder continence.  She has have a previous history of sciatica.  She has taken Tylenol without relief.   Back Pain Associated symptoms: no dysuria, no numbness and no weakness        Past Medical History:  Diagnosis Date  . ADD (attention deficit disorder)   . Cancer [facility])    Resection of right leg melanoma, stage 1  . Chronic insomnia   . Depression   . Diarrhea   . GERD (gastroesophageal reflux disease)   . HLD (hyperlipidemia)   . RLS (restless legs syndrome)   . Stomach ache   . Syncope   . Vein disorder     Patient Active Problem List   Diagnosis Date Noted  . Postoperative examination 03/22/2016  . BMI 34.0-34.9,adult 03/01/2016  . Malignant melanoma of right lower leg ([place]) 03/01/2016  . S/P laparoscopic appendectomy 11/25/2010  . Postoperative intra-abdominal abscess 11/25/2010  . 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 APPENDECTOMY    . CESAREAN SECTION    . melanoma resection Right 2017   resection of right leg.     OB History   No obstetric history on file.     Family History  Problem Relation Age of Onset  . Hypertension Mother   .  Diabetes Father   . Pneumonia Father   . Hypothyroidism Sister   . Hypertension Sister     Social History   Tobacco Use  . Smoking status: Never Smoker  . Smokeless tobacco: Never Used  Substance Use Topics  . Alcohol use: Yes    Comment: occasionally  . Drug use: No    Home Medications Prior to Admission medications   Medication Sig Start Date End Date Taking? Authorizing Provider  amphetamine-dextroamphetamine (ADDERALL) 10 MG tablet Take 10 mg by mouth daily. 07/04/18   [provider]  cyclobenzaprine (FLEXERIL) 10 MG tablet Take 0.5-1 tablets (5-10 mg total) by mouth 2 (two) times daily as needed for muscle spasms. 09/10/19   Margarita Mail, PA-C  DULoxetine (CYMBALTA) 60 MG capsule Take by mouth.    [provider]  HYDROcodone-acetaminophen (NORCO/VICODIN) 5-325 MG tablet Take 1 tablet by mouth every 4 (four) hours as needed. 05/27/19   [provider]  lisinopril-hydrochlorothiazide (ZESTORETIC) 10-12.5 MG tablet  10/01/18   [provider]  morphine (MSIR) 15 MG tablet Take 0.5 tablets (7.5 mg total) by mouth every 4 (four) hours as needed for severe pain. 09/10/19   Margarita Mail, PA-C  omeprazole (PRILOSEC) 20 MG capsule Take 20 mg by mouth daily.    [provider]  terbinafine (LAMISIL) 250 MG tablet Take 1  tablet (250 mg total) by mouth daily. 10/12/18   Wallene Huh, DPM    Allergies    Penicillins, Percocet [oxycodone-acetaminophen], Nsaids, and Tolmetin  Review of Systems   Review of Systems  Constitutional: Positive for activity change.  Genitourinary: Negative for dysuria.  Musculoskeletal: Positive for back pain. Negative for gait problem.  Neurological: Negative for weakness and numbness.    Physical Exam Updated Vital Signs BP (!) 159/99 (BP Location: Right Arm)   Pulse 96   Temp 98.3 F (36.8 C) (Oral)   Resp 20   Ht 5\' 7"  (1.702 m)   Wt 99.8 kg   SpO2 99%   BMI 34.46 kg/m   Physical Exam Vitals  and nursing note reviewed.  Constitutional:      General: She is not in acute distress.    Appearance: She is well-developed. She is not diaphoretic.  HENT:     Head: Normocephalic and atraumatic.  Eyes:     General: No scleral icterus.    Conjunctiva/sclera: Conjunctivae normal.  Cardiovascular:     Rate and Rhythm: Normal rate and regular rhythm.     Heart sounds: Normal heart sounds. No murmur. No friction rub. No gallop.   Pulmonary:     Effort: Pulmonary effort is normal. No respiratory distress.     Breath sounds: Normal breath sounds.  Abdominal:     General: Bowel sounds are normal. There is no distension.     Palpations: Abdomen is soft. There is no mass.     Tenderness: There is no abdominal tenderness. There is no guarding.  Musculoskeletal:     Cervical back: Normal range of motion.     Comments: Patient appears to be in mild to moderate pain, antalgic gait noted. Lumbosacral spine area reveals no local tenderness or mass. Painful and reduced LS ROM noted. Straight leg raise is positive at 70 degrees on left. DTR's, motor strength and sensation normal, including heel and toe gait.  Peripheral pulses are palpable.   Skin:    General: Skin is warm and dry.  Neurological:     Mental Status: She is alert and oriented to person, place, and time.  Psychiatric:        Behavior: Behavior normal.     ED Results / Procedures / Treatments   Labs (all labs ordered are listed, but only abnormal results are displayed) Labs Reviewed - No data to display  EKG None  Radiology DG Lumbar Spine Complete  Result Date: 09/10/2019 CLINICAL DATA:  Lower left back pain. Additional provided: Left-sided low back pain for several days. EXAM: LUMBAR SPINE - COMPLETE 4+ VIEW COMPARISON:  CT abdomen/pelvis 01/08/2015, lumbar spine radiographs 02/14/2011 FINDINGS: Trace L2-L3 and L3-L4 retrolisthesis. No lumbar compression deformity. No convincing pars interarticularis defect is identified on  oblique radiographs. No more than mild disc space narrowing at any level. Facet arthrosis greatest at L4-L5 and L5-S1. IMPRESSION: No lumbar compression deformity. Trace L2-L3 and L3-L4 retrolisthesis. Lumbar spondylosis as described. Electronically Signed   By: Kellie Simmering DO   On: 09/10/2019 14:48    Procedures Procedures (including critical care time)  Medications Ordered in ED Medications  dexamethasone (DECADRON) injection 10 mg (has no administration in time range)  morphine sulfate (PF) 10 MG/ML injection 10 mg (has no administration in time range)  ondansetron (ZOFRAN-ODT) disintegrating tablet 4 mg (has no administration in time range)    ED Course  I have reviewed the triage vital signs and the nursing notes.  Pertinent  labs & imaging results that were available during my care of the patient were reviewed by me and considered in my medical decision making (see chart for details).    MDM Rules/Calculators/A&P                      Patient with back pain.  No neurological deficits and normal neuro exam.  Patient can walk but states is painful.  No loss of bowel or bladder control.  No concern for cauda equina.  No fever, night sweats, weight loss, h/o cancer, IVDU.  RICE protocol and pain medicine indicated and discussed with patient. F/u with PCP if pain is unresolved. PDMP reviewed during this encounter.   Final Clinical Impression(s) / ED Diagnoses Final diagnoses:  Sciatica of left side    Rx / DC Orders ED Discharge Orders         Ordered    cyclobenzaprine (FLEXERIL) 10 MG tablet  2 times daily PRN     09/10/19 1540    morphine (MSIR) 15 MG tablet  Every 4 hours PRN     09/10/19 1540           Margarita Mail, PA-C 09/10/19 1541    Lennice Sites, DO 09/10/19 1801

## 2020-02-04 ENCOUNTER — Telehealth: Payer: Self-pay | Admitting: Oncology

## 2020-02-04 ENCOUNTER — Ambulatory Visit (HOSPITAL_COMMUNITY)
Admission: RE | Admit: 2020-02-04 | Discharge: 2020-02-04 | Disposition: A | Discharge status: Home / Self Care | Payer: 59 | Source: Ambulatory Visit | Attending: Pulmonary Disease | Admitting: Pulmonary Disease

## 2020-02-04 ENCOUNTER — Other Ambulatory Visit: Payer: Self-pay | Admitting: Oncology

## 2020-02-04 DIAGNOSIS — U071 COVID-19: Secondary | ICD-10-CM | POA: Insufficient documentation

## 2020-02-04 MED ORDER — METHYLPREDNISOLONE SODIUM SUCC 125 MG IJ SOLR: 125.0000 mg | Freq: Once | INTRAMUSCULAR | Status: DC | PRN

## 2020-02-04 MED ORDER — SODIUM CHLORIDE 0.9 % IV SOLN
1200.0000 mg | Freq: Once | INTRAVENOUS | Status: AC
  Administered 2020-02-04: 1200 mg via INTRAVENOUS

## 2020-02-04 MED ORDER — FAMOTIDINE IN NACL 20-0.9 MG/50ML-% IV SOLN: 20.0000 mg | Freq: Once | INTRAVENOUS | Status: DC | PRN

## 2020-02-04 MED ORDER — EPINEPHRINE 0.3 MG/0.3ML IJ SOAJ: 0.3000 mg | Freq: Once | INTRAMUSCULAR | Status: DC | PRN

## 2020-02-04 MED ORDER — ALBUTEROL SULFATE HFA 108 (90 BASE) MCG/ACT IN AERS: 2.0000 | INHALATION_SPRAY | Freq: Once | RESPIRATORY_TRACT | Status: DC | PRN

## 2020-02-04 MED ORDER — DIPHENHYDRAMINE HCL 50 MG/ML IJ SOLN: 50.0000 mg | Freq: Once | INTRAMUSCULAR | Status: DC | PRN

## 2020-02-04 MED ORDER — SODIUM CHLORIDE 0.9 % IV SOLN: INTRAVENOUS | Status: DC | PRN

## 2020-02-04 NOTE — Progress Notes (Signed)
  Diagnosis: COVID-19  Physician: Dr. Joya Gaskins  Procedure: [facility] Med: casirivimab\imdevimab infusion - Provided patient with casirivimab\imdevimab fact sheet for patients, parents and caregivers prior to infusion.  Complications: No immediate complications noted.  Discharge: Discharged home   Otho Ket 02/04/2020

## 2020-02-04 NOTE — Telephone Encounter (Signed)
I connected by phone with Mrs. Day to discuss the potential use of an new treatment for mild to moderate COVID-19 viral infection in non-hospitalized patients.   This patient is a age/sex that meets the FDA criteria for Emergency Use Authorization of casirivimab\imdevimab.  Has a (+) direct SARS-CoV-2 viral test result 1. Has mild or moderate COVID-19  2. Is ? 57 years of age and weighs ? 40 kg 3. Is NOT hospitalized due to COVID-19 4. Is NOT requiring oxygen therapy or requiring an increase in baseline oxygen flow rate due to COVID-19 5. Is within 10 days of symptom onset 6. Has at least one of the high risk factor(s) for progression to severe COVID-19 and/or hospitalization as defined in EUA. Specific high risk criteria : Past Medical History:  Diagnosis Date  . ADD (attention deficit disorder)   . Cancer [facility])    Resection of right leg melanoma, stage 1  . Chronic insomnia   . Depression   . Diarrhea   . GERD (gastroesophageal reflux disease)   . HLD (hyperlipidemia)   . RLS (restless legs syndrome)   . Stomach ache   . Syncope   . Vein disorder   ?  ?    Symptom onset  01/31/2020   I have spoken and communicated the following to the patient or parent/caregiver:   1. FDA has authorized the emergency use of casirivimab\imdevimab for the treatment of mild to moderate COVID-19 in adults and pediatric patients with positive results of direct SARS-CoV-2 viral testing who are 53 years of age and older weighing at least 40 kg, and who are at high risk for progressing to severe COVID-19 and/or hospitalization.   2. The significant known and potential risks and benefits of casirivimab\imdevimab, and the extent to which such potential risks and benefits are unknown.   3. Information on available alternative treatments and the risks and benefits of those alternatives, including clinical trials.   4. Patients treated with casirivimab\imdevimab should continue to self-isolate and use  infection control measures (e.g., wear mask, isolate, social distance, avoid sharing personal items, clean and disinfect "high touch" surfaces, and frequent handwashing) according to CDC guidelines.    5. The patient or parent/caregiver has the option to accept or refuse casirivimab\imdevimab .   After reviewing this information with the patient, The patient agreed to proceed with receiving casirivimab\imdevimab infusion and will be provided a copy of the Fact sheet prior to receiving the infusion.Rulon Abide, AGNP-C [phone] ([place])

## 2020-02-04 NOTE — Discharge Instructions (Signed)

## 2020-06-21 IMAGING — CR DG LUMBAR SPINE COMPLETE 4+V
5 series · 5 of 5 positions shown · non-contrast
Comparison: CT abdomen/pelvis 01/08/2015, lumbar spine radiographs
02/14/2011

CLINICAL DATA: Lower left back pain. Additional provided:
Left-sided low back pain for several days.

EXAM:
LUMBAR SPINE - COMPLETE 4+ VIEW

[t l-spine a.p.]
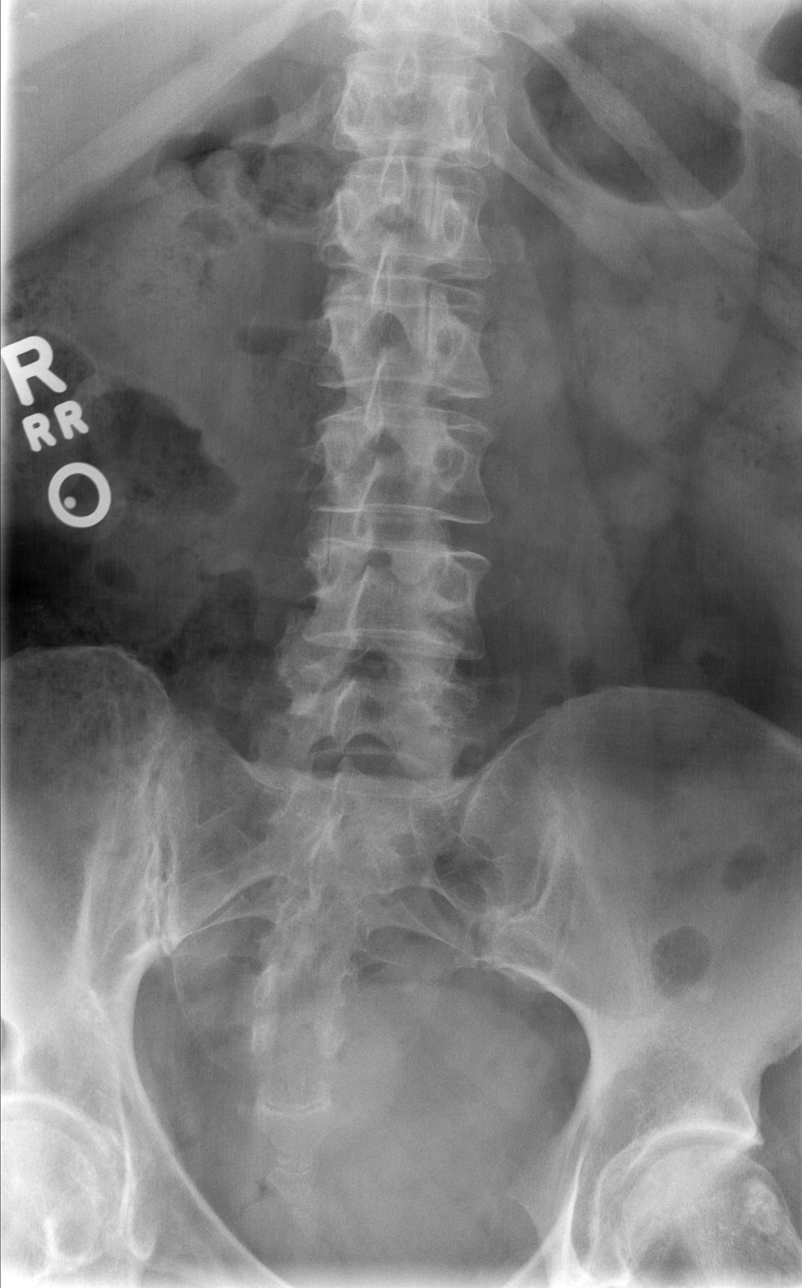

[t l-spine oblique exposure (1 of 2)]
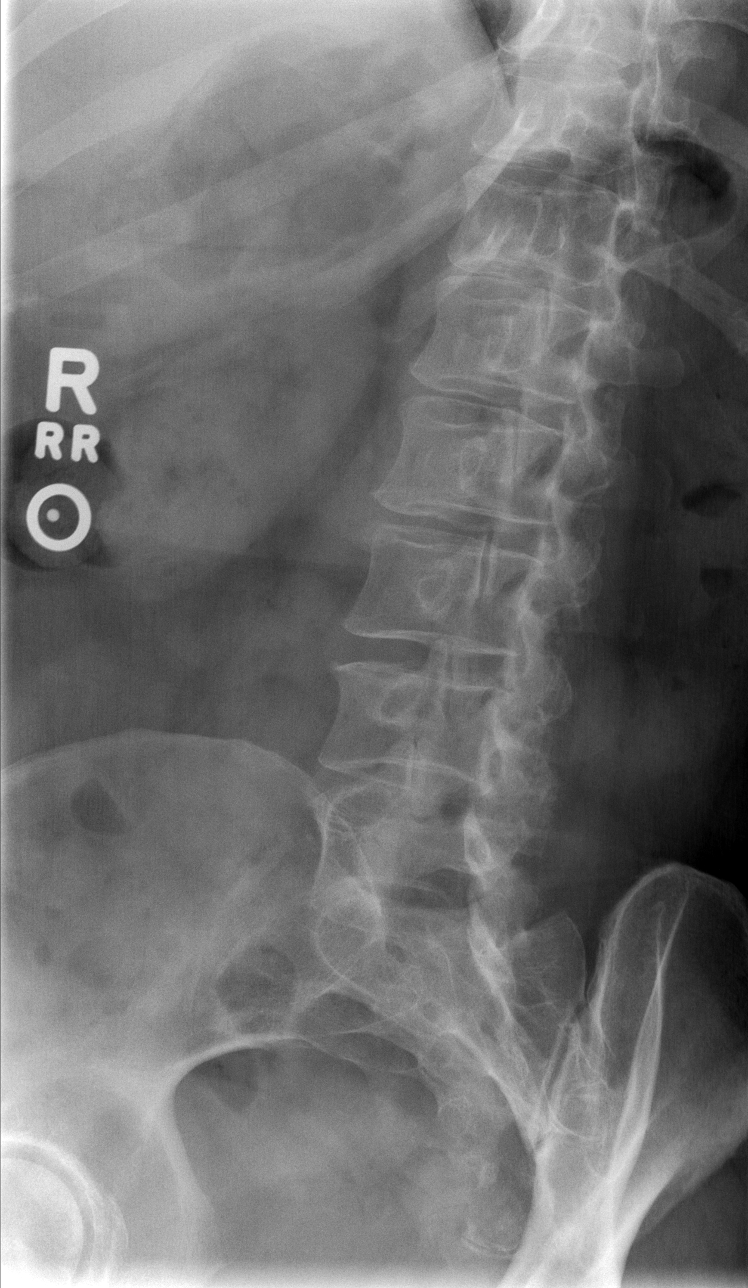

[t l-spine oblique exposure (2 of 2)]
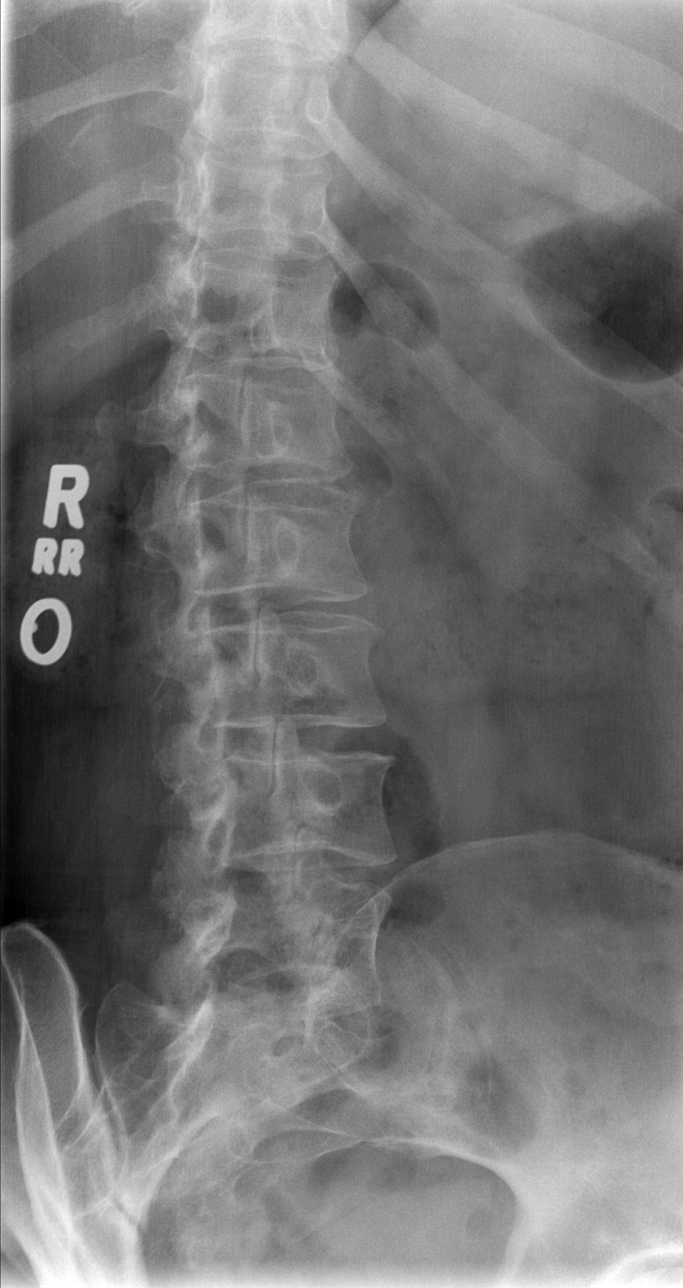

[t l-spine lat]
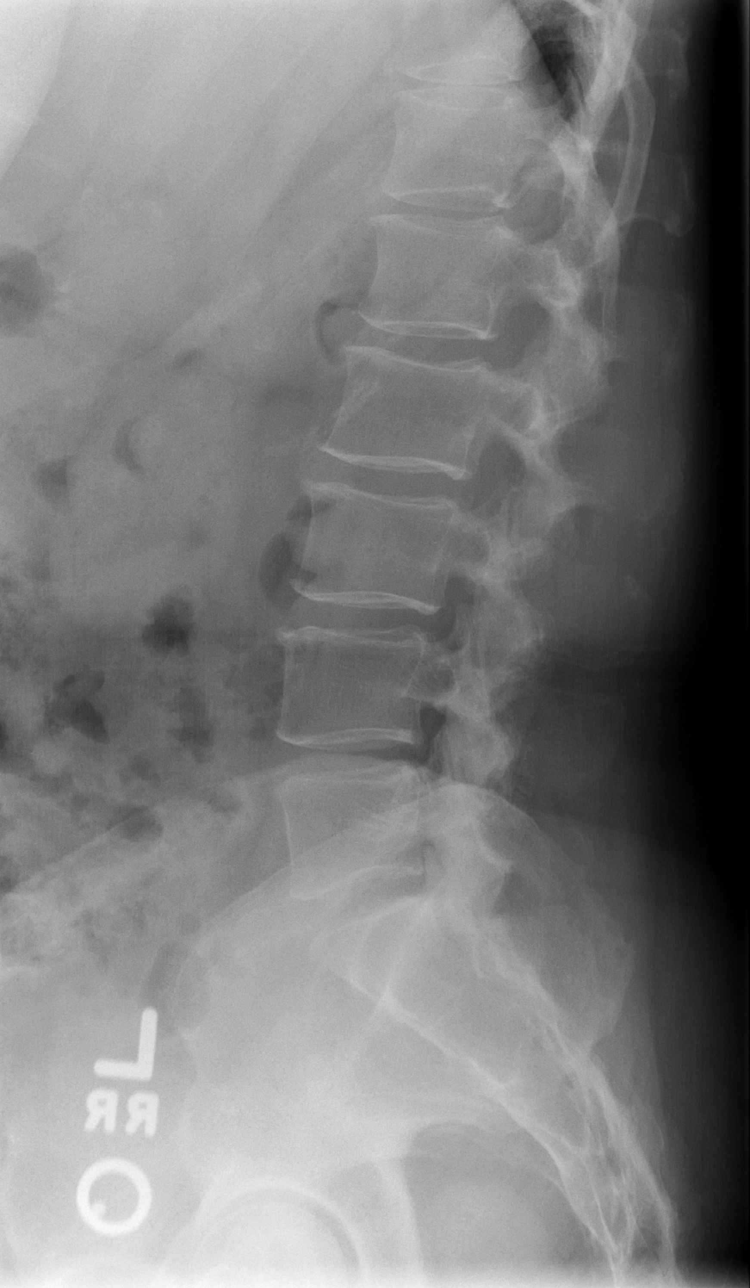

[t l-spine l5-s1 spot]
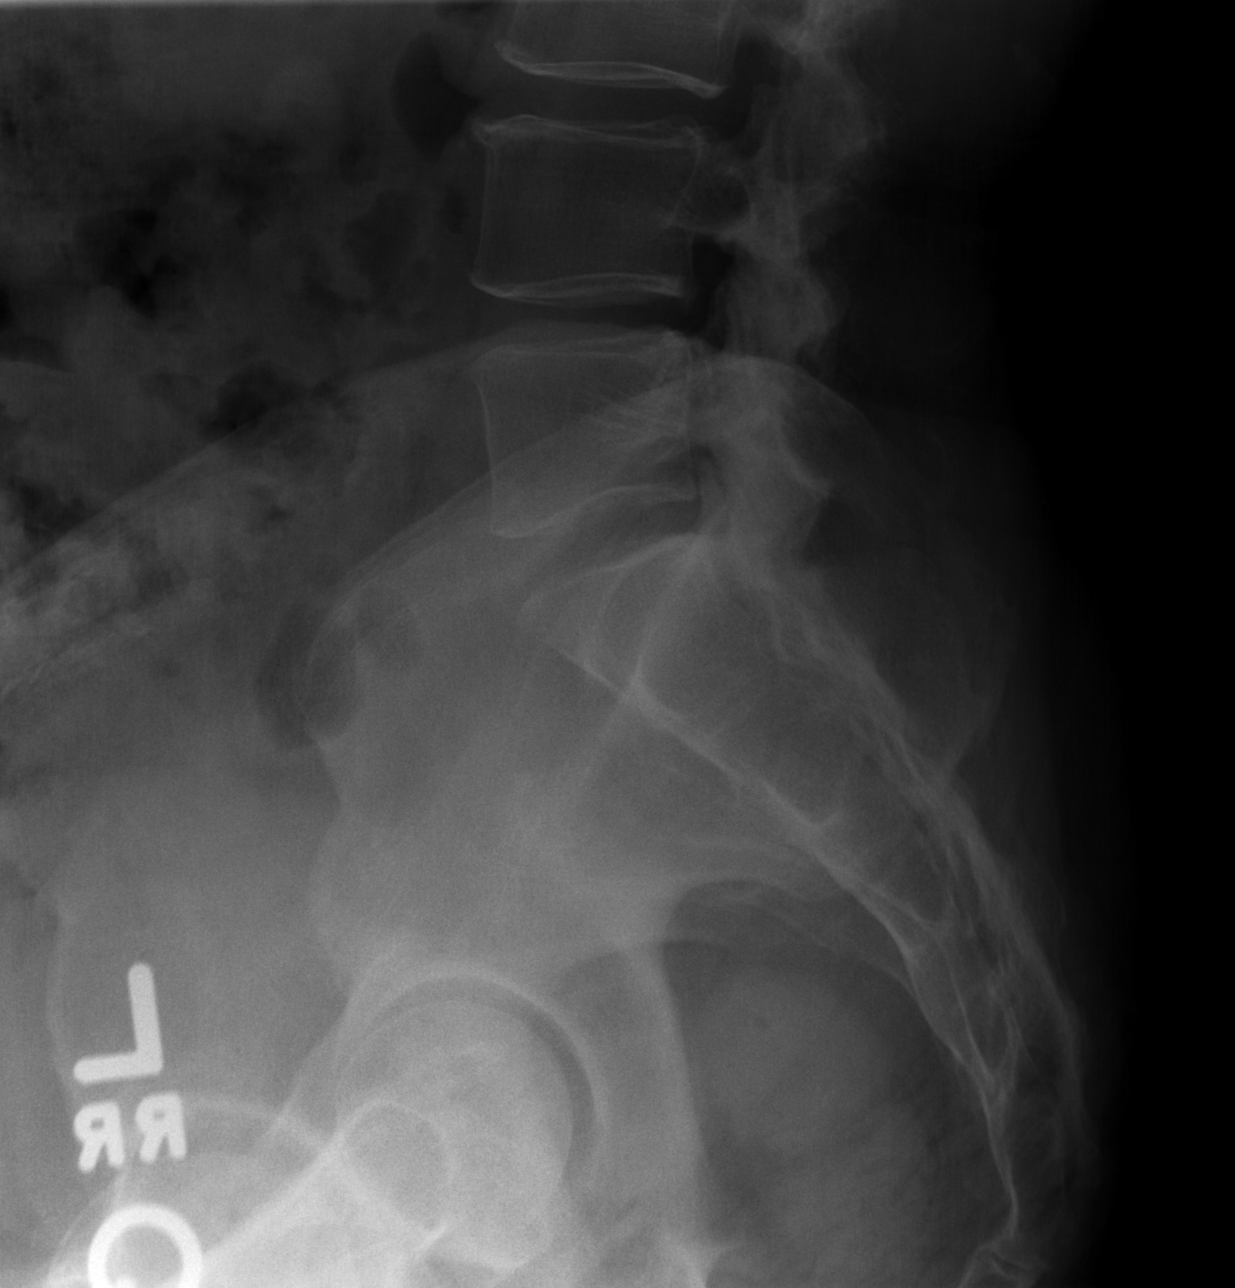

[5 of 5 positions shown; findings below may reference images not displayed]

FINDINGS: Trace L2-L3 and L3-L4 retrolisthesis.

No lumbar compression deformity.

No convincing pars interarticularis defect is identified on oblique
radiographs.

No more than mild disc space narrowing at any level. Facet arthrosis
greatest at L4-L5 and L5-S1.
IMPRESSION: No lumbar compression deformity.

Trace L2-L3 and L3-L4 retrolisthesis.

Lumbar spondylosis as described.

## 2020-08-20 ENCOUNTER — Other Ambulatory Visit (HOSPITAL_BASED_OUTPATIENT_CLINIC_OR_DEPARTMENT_OTHER): Payer: Self-pay

## 2020-08-20 MED ORDER — CARESTART COVID-19 HOME TEST VI KIT
PACK | 0 refills | Status: DC
  Filled 2020-08-20: qty 2, 4d supply, fill #0

## 2020-10-14 ENCOUNTER — Other Ambulatory Visit (HOSPITAL_BASED_OUTPATIENT_CLINIC_OR_DEPARTMENT_OTHER): Payer: Self-pay

## 2020-10-14 MED ORDER — LISINOPRIL-HYDROCHLOROTHIAZIDE 10-12.5 MG PO TABS
ORAL_TABLET | ORAL | 0 refills | Status: DC
  Filled 2020-10-14: qty 60, 60d supply, fill #0

## 2020-10-14 MED ORDER — CARESTART COVID-19 HOME TEST VI KIT
PACK | 0 refills | Status: AC
  Filled 2020-10-14: qty 2, 4d supply, fill #0

## 2020-10-14 MED ORDER — DULOXETINE HCL 60 MG PO CPEP
ORAL_CAPSULE | ORAL | 3 refills | Status: DC
  Filled 2020-10-14: qty 30, 30d supply, fill #0
  Filled 2020-11-13: qty 30, 30d supply, fill #1
  Filled 2020-12-23: qty 30, 30d supply, fill #2
  Filled 2021-01-20: qty 30, 30d supply, fill #3

## 2020-11-13 ENCOUNTER — Other Ambulatory Visit (HOSPITAL_BASED_OUTPATIENT_CLINIC_OR_DEPARTMENT_OTHER): Payer: Self-pay

## 2020-12-16 ENCOUNTER — Other Ambulatory Visit (HOSPITAL_BASED_OUTPATIENT_CLINIC_OR_DEPARTMENT_OTHER): Payer: Self-pay

## 2020-12-16 MED ORDER — NITROFURANTOIN MONOHYD MACRO 100 MG PO CAPS
ORAL_CAPSULE | ORAL | 0 refills | Status: AC
  Filled 2020-12-16: qty 14, 7d supply, fill #0

## 2020-12-23 ENCOUNTER — Other Ambulatory Visit (HOSPITAL_BASED_OUTPATIENT_CLINIC_OR_DEPARTMENT_OTHER): Payer: Self-pay

## 2020-12-24 ENCOUNTER — Other Ambulatory Visit (HOSPITAL_BASED_OUTPATIENT_CLINIC_OR_DEPARTMENT_OTHER): Payer: Self-pay

## 2020-12-24 MED ORDER — CIPROFLOXACIN HCL 500 MG PO TABS
ORAL_TABLET | ORAL | 0 refills | Status: AC
  Filled 2020-12-24: qty 14, 7d supply, fill #0

## 2021-01-20 ENCOUNTER — Other Ambulatory Visit (HOSPITAL_BASED_OUTPATIENT_CLINIC_OR_DEPARTMENT_OTHER): Payer: Self-pay

## 2021-02-03 ENCOUNTER — Emergency Department (HOSPITAL_BASED_OUTPATIENT_CLINIC_OR_DEPARTMENT_OTHER)
Admission: EM | Admit: 2021-02-03 | Discharge: 2021-02-03 | Disposition: A | Discharge status: Home / Self Care | Payer: Self-pay | Attending: Emergency Medicine | Admitting: Emergency Medicine

## 2021-02-03 ENCOUNTER — Emergency Department (HOSPITAL_BASED_OUTPATIENT_CLINIC_OR_DEPARTMENT_OTHER): Payer: Self-pay

## 2021-02-03 ENCOUNTER — Encounter (HOSPITAL_BASED_OUTPATIENT_CLINIC_OR_DEPARTMENT_OTHER): Payer: Self-pay | Admitting: Emergency Medicine

## 2021-02-03 DIAGNOSIS — Z85828 Personal history of other malignant neoplasm of skin: Secondary | ICD-10-CM | POA: Insufficient documentation

## 2021-02-03 DIAGNOSIS — M79605 Pain in left leg: Secondary | ICD-10-CM | POA: Insufficient documentation

## 2021-02-03 DIAGNOSIS — R208 Other disturbances of skin sensation: Secondary | ICD-10-CM

## 2021-02-03 MED ORDER — PREDNISONE 10 MG (21) PO TBPK: ORAL_TABLET | Freq: Every day | ORAL | 0 refills | Status: AC

## 2021-02-03 MED ORDER — PREDNISONE 50 MG PO TABS
60.0000 mg | ORAL_TABLET | Freq: Once | ORAL | Status: AC
  Administered 2021-02-03: 60 mg via ORAL
  Filled 2021-02-03: qty 1

## 2021-02-03 NOTE — ED Notes (Signed)
Patient left ED with ABCs intact, alert and oriented x4, respirations even and unlabored. Discharge instructions reviewed and all questions answered.   

## 2021-02-03 NOTE — Discharge Instructions (Addendum)
Please follow up with your PCP regarding ED visit today and for further evaluation of your leg pain/decreased sensation.   Pick up medication and take as prescribed  Return to the ED for any new/worsening symptoms

## 2021-02-03 NOTE — ED Triage Notes (Signed)
Pt states she is having left leg pain and numbness  Pt states she has a knot in her left thigh that started a couple of months ago and has slowly gotten bigger  Pt reports numbness in her leg started a couple months ago but was intermittent but today her leg has been constantly numb  Pt states the area is tender upon palpation

## 2021-02-03 NOTE — ED Provider Notes (Signed)
[facility] EMERGENCY DEPARTMENT Provider Note   CSN: [id number] Arrival date & time: 02/03/21  2000     History Chief Complaint  Patient presents with   Leg Pain    Rebecca Caldwell is a 60 y.o. female who presents to the ED today with complaint of gradual onset, intermittent, left leg pain and numbness/tingling for the past several months. Pt reports it seemed positional at first and worse with prolonged sitting or walking. She states that she did not think much of it however today began having worsening tingling that was more constant causing concern. She states she has also noticed a "knot" to her left anterior thigh for the past few months that she thinks is getting slightly bigger in nature. She is unsure if this could be causing her symptoms. She does report hx of sciatica in the past with intermittent radiculopathy however she is not currently having any back pain. She does admit she has not been evaluated for these symptoms in the past because she thought she would be referred somewhere else and it would take more time to figure out. No other complaints at this time.   The history is provided by the patient and medical records.      Past Medical History:  Diagnosis Date   ADD (attention deficit disorder)    Cancer ([place])    Resection of right leg melanoma, stage 1   Chronic insomnia    Depression    Diarrhea    GERD (gastroesophageal reflux disease)    HLD (hyperlipidemia)    RLS (restless legs syndrome)    Stomach ache    Syncope    Vein disorder     Patient Active Problem List   Diagnosis Date Noted   Postoperative examination 03/22/2016   BMI 34.0-34.9,adult 03/01/2016   Malignant melanoma of right lower leg ([place]) 03/01/2016   S/P laparoscopic appendectomy 11/25/2010   Postoperative intra-abdominal abscess 11/25/2010   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     OB History   No obstetric history on file.     Family History  Problem Relation Age of Onset   Hypertension Mother    Diabetes Father    Pneumonia Father    Hypothyroidism Sister    Hypertension Sister     Social History   Tobacco Use   Smoking status: Never   Smokeless tobacco: Never  Vaping Use   Vaping Use: Never used  Substance Use Topics   Alcohol use: Yes    Comment: occasionally   Drug use: No    Home Medications Prior to Admission medications   Medication Sig Start Date End Date Taking? Authorizing Provider  predniSONE (STERAPRED UNI-PAK 21 TAB) 10 MG (21) TBPK tablet Take by mouth daily. Follow package insert 02/03/21  Yes Armstrong Creasy, PA-C  amphetamine-dextroamphetamine (ADDERALL) 10 MG tablet Take 10 mg by mouth daily. 07/04/18   [provider]  ciprofloxacin (CIPRO) 500 MG tablet Take 1 tablet Orally every 12 hrs 7 days 12/24/20     COVID-19 At Home Antigen Test [facility] COVID-19 HOME TEST) KIT USE AS DIRECTED WITHIN PACKAGE INSTRUCTIONS 10/14/20   Clementeen Graham, RPH  cyclobenzaprine (FLEXERIL) 10 MG tablet Take 0.5-1 tablets (5-10 mg total) by mouth 2 (two) times daily as needed for muscle spasms. 09/10/19  Margarita Mail, PA-C  DULoxetine (CYMBALTA) 60 MG capsule Take by mouth.    [provider]  DULoxetine (CYMBALTA) 60 MG capsule TAKE 1 CAPSULE BY MOUTH DAILY 05/07/20     HYDROcodone-acetaminophen (NORCO/VICODIN) 5-325 MG tablet Take 1 tablet by mouth every 4 (four) hours as needed. 05/27/19   [provider]  lisinopril-hydrochlorothiazide (ZESTORETIC) 10-12.5 MG tablet  10/01/18   [provider]  lisinopril-hydrochlorothiazide (ZESTORETIC) 10-12.5 MG tablet TAKE 1 TABLET BY MOUTH EVERYDAY 04/14/20     morphine (MSIR) 15 MG tablet Take 0.5 tablets (7.5 mg total) by mouth every 4 (four) hours as needed for severe pain. 09/10/19    Margarita Mail, PA-C  nitrofurantoin, macrocrystal-monohydrate, (MACROBID) 100 MG capsule Take 1 capsule by mouth twice a day for 7 days 12/16/20     omeprazole (PRILOSEC) 20 MG capsule Take 20 mg by mouth daily.    [provider]  terbinafine (LAMISIL) 250 MG tablet Take 1 tablet (250 mg total) by mouth daily. 10/12/18   Wallene Huh, DPM    Allergies    Penicillins, Percocet [oxycodone-acetaminophen], Nsaids, and Tolmetin  Review of Systems   Review of Systems  Constitutional:  Negative for chills and fever.  Musculoskeletal:  Positive for arthralgias. Negative for back pain.  Skin:  Negative for wound.       + knot to thigh  Neurological:  Negative for weakness and numbness.       + paresthesias  All other systems reviewed and are negative.  Physical Exam Updated Vital Signs BP 133/83 (BP Location: Left Arm)   Pulse 98   Temp 98.4 F (36.9 C) (Oral)   Resp 16   Ht 5' 8"  (1.727 m)   Wt 104.3 kg   SpO2 98%   BMI 34.97 kg/m   Physical Exam Vitals and nursing note reviewed.  Constitutional:      Appearance: She is not ill-appearing.  HENT:     Head: Normocephalic and atraumatic.  Eyes:     Conjunctiva/sclera: Conjunctivae normal.  Cardiovascular:     Rate and Rhythm: Normal rate and regular rhythm.  Pulmonary:     Effort: Pulmonary effort is normal.     Breath sounds: Normal breath sounds. No wheezing, rhonchi or rales.  Abdominal:     Palpations: Abdomen is soft.     Tenderness: There is no abdominal tenderness.  Musculoskeletal:     Cervical back: Neck supple.     Comments: 2 x 2 cm mobile mass to anterior left thigh with TTP. No overlying skin changes to LLE. ROM intact to L hip, knee, ankle. 2+ DP pulse. Strength equal bilaterally. Sensation intact - pt able to discern dull and sharp sensation to BLEs however subjective diminished sensation along L3-L4 dermatome to left anterior/lateral thigh; does not extend to left lower leg or medial thigh  No C,  T, or L midline spinal TTP  Skin:    General: Skin is warm and dry.  Neurological:     Mental Status: She is alert.    ED Results / Procedures / Treatments   Labs (all labs ordered are listed, but only abnormal results are displayed) Labs Reviewed - No data to display  EKG None  Radiology US Venous Img Lower  Left (DVT Study)  Result Date: 02/03/2021 CLINICAL DATA:  Left leg pain EXAM: LEFT LOWER EXTREMITY VENOUS DOPPLER ULTRASOUND TECHNIQUE: Gray-scale sonography with graded compression, as well as color Doppler and duplex ultrasound were performed to evaluate the lower extremity deep  venous systems from the level of the common femoral vein and including the common femoral, femoral, profunda femoral, popliteal and calf veins including the posterior tibial, peroneal and gastrocnemius veins when visible. The superficial great saphenous vein was also interrogated. Spectral Doppler was utilized to evaluate flow at rest and with distal augmentation maneuvers in the common femoral, femoral and popliteal veins. COMPARISON:  None. FINDINGS: Contralateral Common Femoral Vein: Respiratory phasicity is normal and symmetric with the symptomatic side. No evidence of thrombus. Normal compressibility. Common Femoral Vein: No evidence of thrombus. Normal compressibility, respiratory phasicity and response to augmentation. Saphenofemoral Junction: No evidence of thrombus. Normal compressibility and flow on color Doppler imaging. Profunda Femoral Vein: No evidence of thrombus. Normal compressibility and flow on color Doppler imaging. Femoral Vein: No evidence of thrombus. Normal compressibility, respiratory phasicity and response to augmentation. Popliteal Vein: No evidence of thrombus. Normal compressibility, respiratory phasicity and response to augmentation. Calf Veins: No evidence of thrombus. Normal compressibility and flow on color Doppler imaging. Superficial Great Saphenous Vein: No evidence of thrombus.  Normal compressibility. Venous Reflux:  None. Other Findings:  None. IMPRESSION: No evidence of deep venous thrombosis. Electronically Signed   By: Inez Catalina M.D.   On: 02/03/2021 21:15    Procedures Procedures   Medications Ordered in ED Medications  predniSONE (DELTASONE) tablet 60 mg (60 mg Oral Given 02/03/21 2111)    ED Course  I have reviewed the triage vital signs and the nursing notes.  Pertinent labs & imaging results that were available during my care of the patient were reviewed by me and considered in my medical decision making (see chart for details).    MDM Rules/Calculators/A&P                           60 year old female who presents to the ED today with complaint of left lateral leg thigh pain as well as intermittent tingling/numbness for the past several months that has been consistent today.  Has not been evaluated for same.  Also has noted a knot to her anterior left thigh that is painful.  On arrival to the ED vitals are stable and patient appears to be in no acute distress.  On my exam she does have a 2 x 2 centimeter mobile mass/knot to her anterior thigh that is slightly painful.  There is no overlying skin changes at this time.  No leg swelling compared to the right lower extremity.  She has full range of motion of the left lower extremity.  She has equal strength throughout.  She does have slightly subjective diminished sensation along the left lateral thigh however does not extend into the lower or medial portions of the same dermatome.  She does have history of sciatica however not currently having any back pain.  This appears she had an x-ray last year which did show some retrolisthesis at L2/L3 and L3/L4.  Question if she is having some radicular pain to this area secondary to the known listhesis.  Given her not we will plan for ultrasound for further evaluation.  If negative we will plan to discharge home with PCP follow-up.  Given she is not having any back pain  and is otherwise neurologically intact I do not feel she requires any further imaging at this time including CT scan of the back or MRI as she has no red flag symptoms today concerning for cauda equina, spinal epidural abscess, AAA.   Ultrasound negative Will  discharge home at this time with PCP follow up. Pt in agreement with plan and stable for discharge home.   This note was prepared using Dragon voice recognition software and may include unintentional dictation errors due to the inherent limitations of voice recognition software.   Final Clinical Impression(s) / ED Diagnoses Final diagnoses:  Left leg pain  Decreased sensation of lower extremity    Rx / DC Orders ED Discharge Orders          Ordered    predniSONE (STERAPRED UNI-PAK 21 TAB) 10 MG (21) TBPK tablet  Daily        02/03/21 2146             Discharge Instructions      Please follow up with your PCP regarding ED visit today and for further evaluation of your leg pain/decreased sensation.   Pick up medication and take as prescribed  Return to the ED for any new/worsening symptoms       Eustaquio Maize, Hershal Coria 02/03/21 2148    Blanchie Dessert, MD 02/05/21 1320

## 2021-02-05 ENCOUNTER — Other Ambulatory Visit (HOSPITAL_BASED_OUTPATIENT_CLINIC_OR_DEPARTMENT_OTHER): Payer: Self-pay

## 2021-02-05 MED ORDER — CARESTART COVID-19 HOME TEST VI KIT
PACK | 0 refills | Status: DC
  Filled 2021-02-05: qty 4, 4d supply, fill #0

## 2021-02-11 ENCOUNTER — Other Ambulatory Visit: Payer: Self-pay | Admitting: Internal Medicine

## 2021-02-11 ENCOUNTER — Other Ambulatory Visit (HOSPITAL_COMMUNITY): Payer: Self-pay | Admitting: Internal Medicine

## 2021-02-11 DIAGNOSIS — M5416 Radiculopathy, lumbar region: Secondary | ICD-10-CM

## 2021-02-19 ENCOUNTER — Other Ambulatory Visit (HOSPITAL_BASED_OUTPATIENT_CLINIC_OR_DEPARTMENT_OTHER): Payer: Self-pay

## 2021-03-02 ENCOUNTER — Other Ambulatory Visit (HOSPITAL_BASED_OUTPATIENT_CLINIC_OR_DEPARTMENT_OTHER): Payer: Self-pay

## 2021-03-02 MED ORDER — LISINOPRIL-HYDROCHLOROTHIAZIDE 10-12.5 MG PO TABS
ORAL_TABLET | ORAL | 3 refills | Status: AC
  Filled 2021-03-02: qty 90, 90d supply, fill #0
  Filled 2021-11-19: qty 90, 90d supply, fill #1
  Filled 2022-02-23: qty 90, 90d supply, fill #2

## 2021-03-02 MED ORDER — DULOXETINE HCL 60 MG PO CPEP
ORAL_CAPSULE | ORAL | 1 refills | Status: AC
  Filled 2021-03-02: qty 90, 90d supply, fill #0
  Filled 2021-11-19: qty 90, 90d supply, fill #1

## 2021-05-05 ENCOUNTER — Other Ambulatory Visit (HOSPITAL_BASED_OUTPATIENT_CLINIC_OR_DEPARTMENT_OTHER): Payer: Self-pay

## 2021-05-05 MED ORDER — CARESTART COVID-19 HOME TEST VI KIT
PACK | 0 refills | Status: DC
  Filled 2021-05-05: qty 4, 4d supply, fill #0

## 2021-06-25 ENCOUNTER — Other Ambulatory Visit (HOSPITAL_BASED_OUTPATIENT_CLINIC_OR_DEPARTMENT_OTHER): Payer: Self-pay

## 2021-06-25 MED ORDER — DULOXETINE HCL 60 MG PO CPEP
ORAL_CAPSULE | ORAL | 0 refills | Status: AC
  Filled 2021-06-25: qty 30, 30d supply, fill #0

## 2021-06-25 MED ORDER — OMEPRAZOLE 20 MG PO CPDR
DELAYED_RELEASE_CAPSULE | ORAL | 0 refills | Status: DC
  Filled 2021-06-25: qty 30, 30d supply, fill #0

## 2021-06-25 MED ORDER — LISINOPRIL-HYDROCHLOROTHIAZIDE 10-12.5 MG PO TABS
ORAL_TABLET | ORAL | 0 refills | Status: AC
  Filled 2021-06-25: qty 30, 30d supply, fill #0

## 2021-06-28 ENCOUNTER — Other Ambulatory Visit (HOSPITAL_BASED_OUTPATIENT_CLINIC_OR_DEPARTMENT_OTHER): Payer: Self-pay

## 2021-06-28 MED ORDER — CARESTART COVID-19 HOME TEST VI KIT
PACK | 0 refills | Status: AC
  Filled 2021-06-28: qty 4, 4d supply, fill #0

## 2021-08-03 ENCOUNTER — Other Ambulatory Visit (HOSPITAL_BASED_OUTPATIENT_CLINIC_OR_DEPARTMENT_OTHER): Payer: Self-pay

## 2021-08-03 MED ORDER — LISINOPRIL-HYDROCHLOROTHIAZIDE 10-12.5 MG PO TABS
ORAL_TABLET | ORAL | 0 refills | Status: AC
  Filled 2021-08-03: qty 30, 30d supply, fill #0

## 2021-08-03 MED ORDER — DULOXETINE HCL 60 MG PO CPEP
ORAL_CAPSULE | ORAL | 0 refills | Status: AC
  Filled 2021-08-03: qty 30, 30d supply, fill #0

## 2021-08-03 MED ORDER — OMEPRAZOLE 20 MG PO CPDR
DELAYED_RELEASE_CAPSULE | ORAL | 0 refills | Status: DC
  Filled 2021-08-03: qty 30, 30d supply, fill #0

## 2021-09-07 ENCOUNTER — Other Ambulatory Visit (HOSPITAL_BASED_OUTPATIENT_CLINIC_OR_DEPARTMENT_OTHER): Payer: Self-pay

## 2021-09-07 MED ORDER — DULOXETINE HCL 60 MG PO CPEP
ORAL_CAPSULE | ORAL | 0 refills | Status: AC
  Filled 2021-09-07: qty 30, 30d supply, fill #0

## 2021-09-07 MED ORDER — LISINOPRIL-HYDROCHLOROTHIAZIDE 10-12.5 MG PO TABS
ORAL_TABLET | ORAL | 0 refills | Status: AC
  Filled 2021-09-07: qty 30, 30d supply, fill #0

## 2021-09-07 MED ORDER — OMEPRAZOLE 20 MG PO CPDR
DELAYED_RELEASE_CAPSULE | ORAL | 0 refills | Status: DC
  Filled 2021-09-07: qty 30, 30d supply, fill #0

## 2021-11-15 IMAGING — US US EXTREM LOW VENOUS*L*
1 series · 13 of 24 positions shown · non-contrast
Comparison: None.

CLINICAL DATA: Left leg pain



[Series 1: us extrem low venous*left* · 13 of 29 slices shown]
[im 1/29]
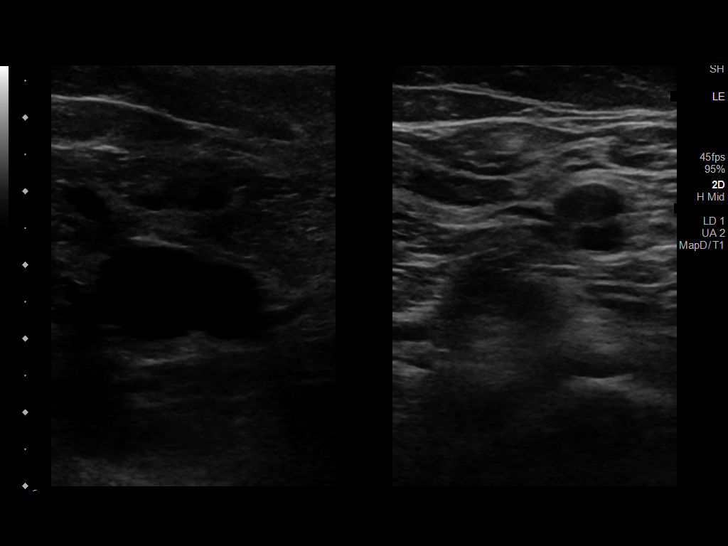
[im 3/29]
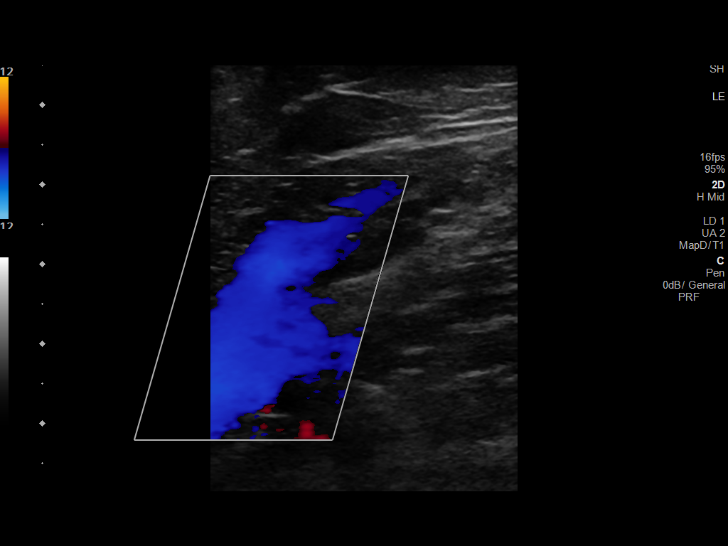
[im 5/29]
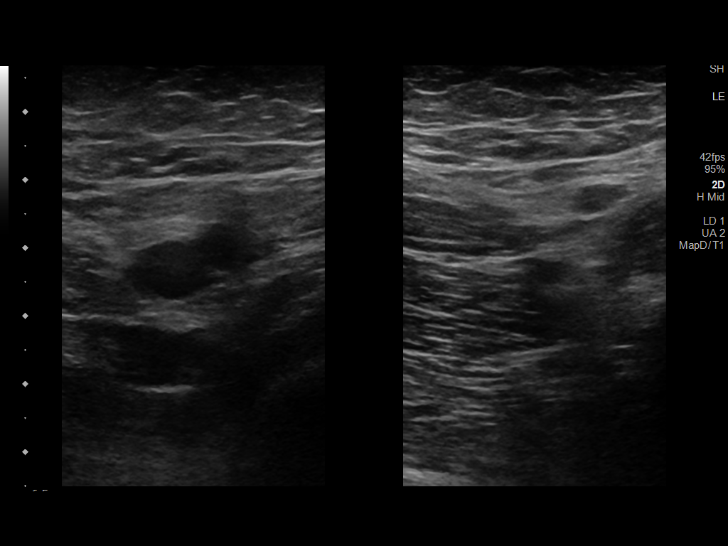
[im 8/29]
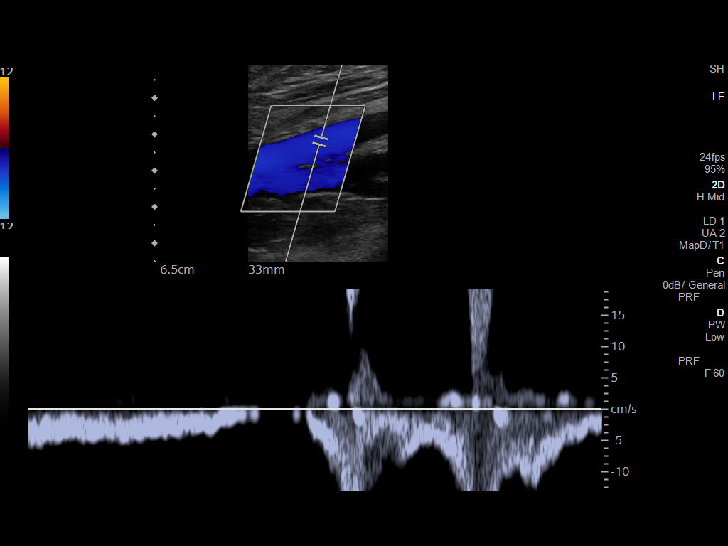
[im 10/29]
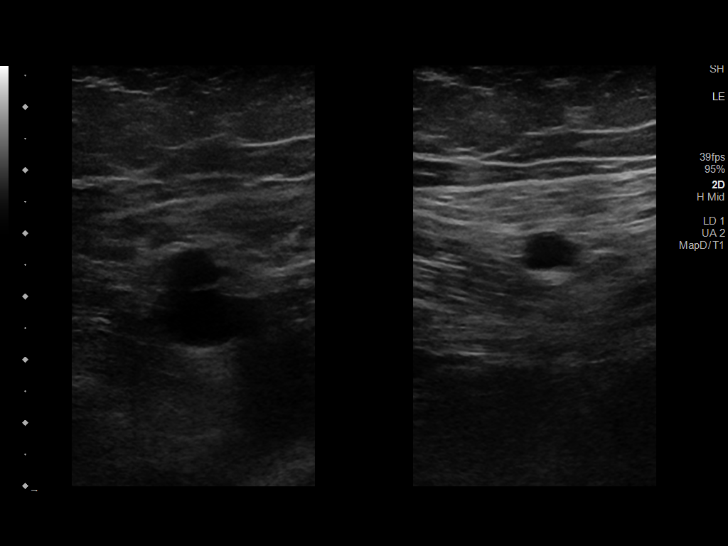
[im 13/29]
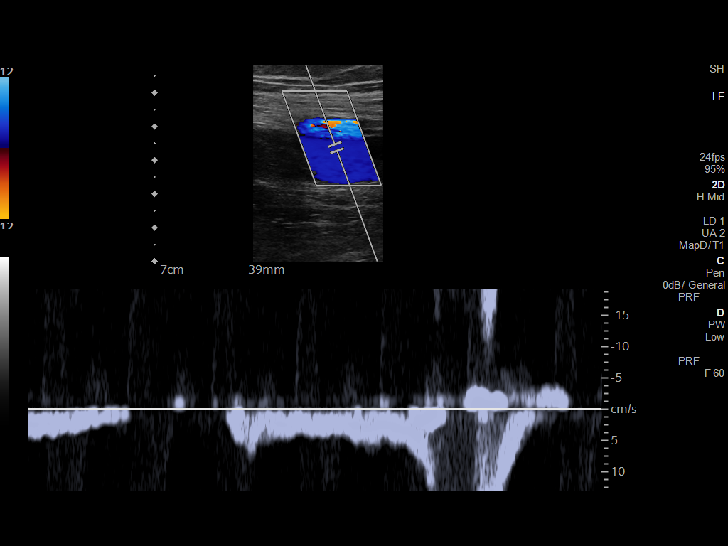
[im 15/29]
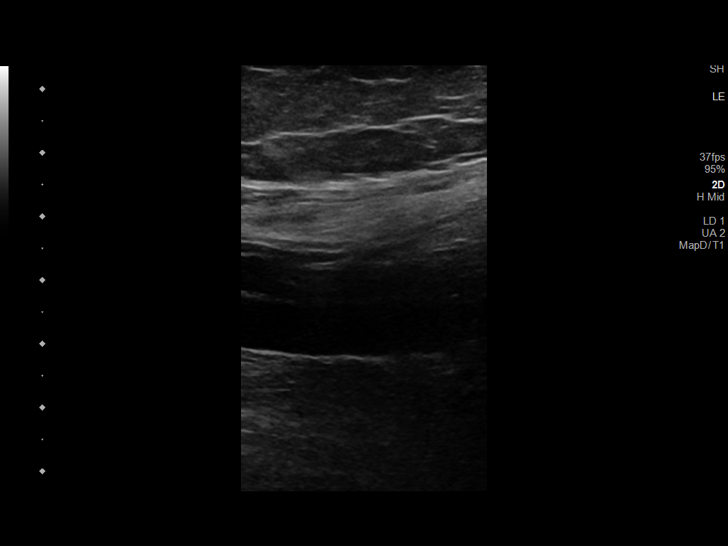
[im 16/29]
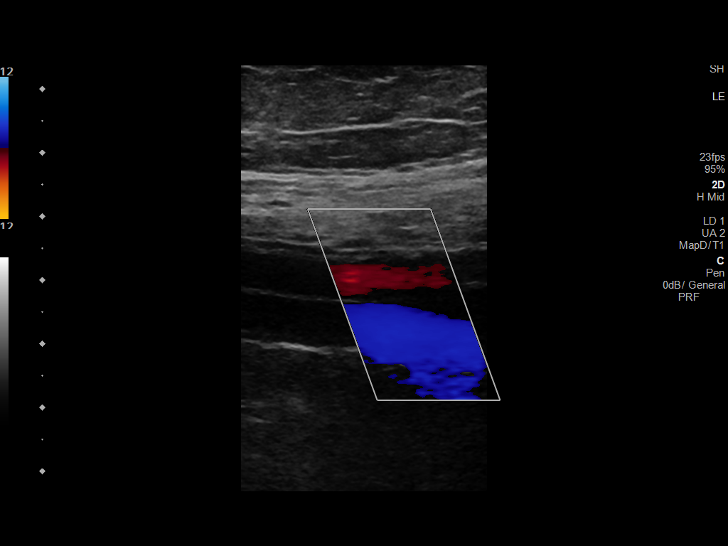
[im 19/29]
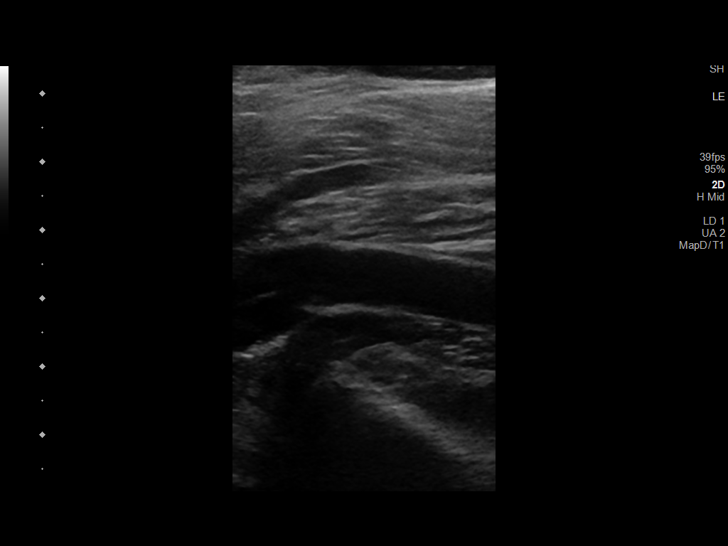
[im 21/29]
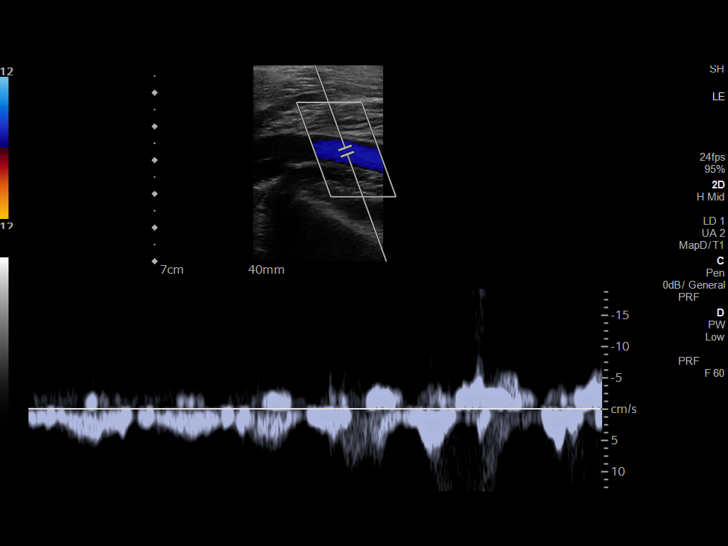
[im 24/29]
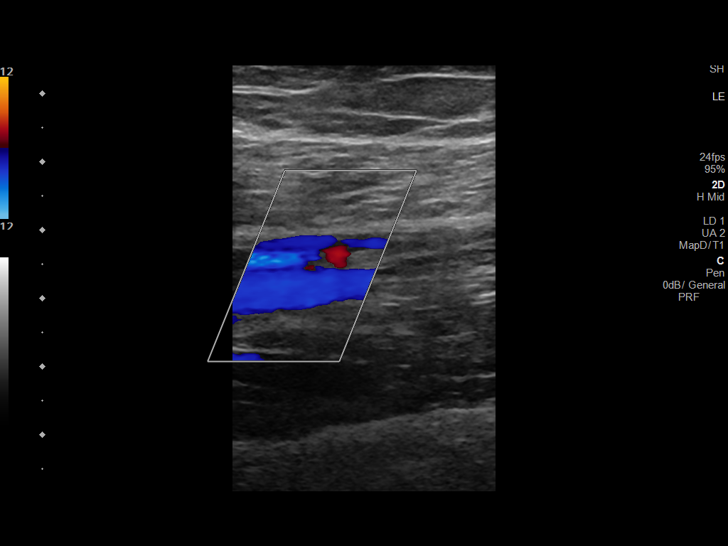
[im 26/29]
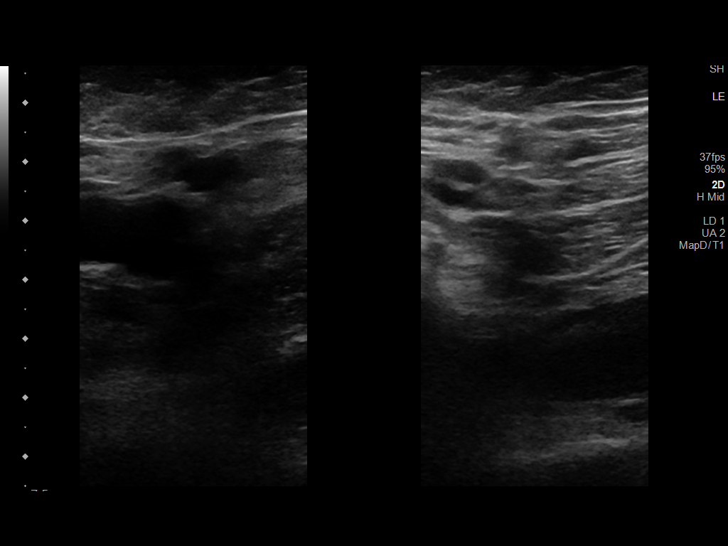
[im 29/29]
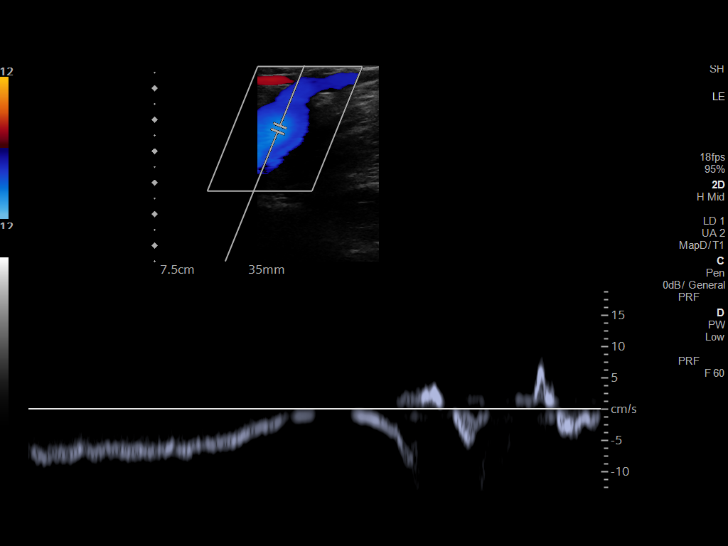

[13 of 24 positions shown; findings below may reference images not displayed]

FINDINGS: Contralateral Common Femoral Vein: Respiratory phasicity is normal
and symmetric with the symptomatic side. No evidence of thrombus.
Normal compressibility.

Common Femoral Vein: No evidence of thrombus. Normal
compressibility, respiratory phasicity and response to augmentation.

Saphenofemoral Junction: No evidence of thrombus. Normal
compressibility and flow on color Doppler imaging.

Profunda Femoral Vein: No evidence of thrombus. Normal
compressibility and flow on color Doppler imaging.

Femoral Vein: No evidence of thrombus. Normal compressibility,
respiratory phasicity and response to augmentation.

Popliteal Vein: No evidence of thrombus. Normal compressibility,
respiratory phasicity and response to augmentation.

Calf Veins: No evidence of thrombus. Normal compressibility and flow
on color Doppler imaging.

Superficial Great Saphenous Vein: No evidence of thrombus. Normal
compressibility.

Venous Reflux:  None.

Other Findings:  None.
IMPRESSION: No evidence of deep venous thrombosis.

## 2021-11-19 ENCOUNTER — Other Ambulatory Visit (HOSPITAL_BASED_OUTPATIENT_CLINIC_OR_DEPARTMENT_OTHER): Payer: Self-pay

## 2021-11-19 MED ORDER — DULOXETINE HCL 60 MG PO CPEP
60.0000 mg | ORAL_CAPSULE | Freq: Every day | ORAL | 1 refills | Status: DC
  Filled 2022-02-23: qty 90, 90d supply, fill #0
  Filled 2022-05-04 – 2022-05-06 (×2): qty 90, 90d supply, fill #1

## 2021-11-19 MED ORDER — LISINOPRIL-HYDROCHLOROTHIAZIDE 10-12.5 MG PO TABS
1.0000 | ORAL_TABLET | Freq: Every day | ORAL | 4 refills | Status: DC
  Filled 2022-06-14: qty 30, 30d supply, fill #0
  Filled 2022-07-07 – 2022-07-08 (×2): qty 30, 30d supply, fill #1
  Filled 2022-08-18 (×2): qty 30, 30d supply, fill #2
  Filled 2022-09-19: qty 30, 30d supply, fill #3
  Filled 2022-10-19: qty 30, 30d supply, fill #4

## 2021-11-19 MED ORDER — OMEPRAZOLE 20 MG PO CPDR
20.0000 mg | DELAYED_RELEASE_CAPSULE | Freq: Every day | ORAL | 4 refills | Status: DC
  Filled 2021-11-19: qty 30, 30d supply, fill #0
  Filled 2022-01-10: qty 30, 30d supply, fill #1
  Filled 2022-02-09: qty 30, 30d supply, fill #2
  Filled 2022-03-17: qty 30, 30d supply, fill #3
  Filled 2022-04-20: qty 30, 30d supply, fill #4

## 2021-11-22 ENCOUNTER — Other Ambulatory Visit (HOSPITAL_BASED_OUTPATIENT_CLINIC_OR_DEPARTMENT_OTHER): Payer: Self-pay

## 2022-01-10 ENCOUNTER — Other Ambulatory Visit (HOSPITAL_BASED_OUTPATIENT_CLINIC_OR_DEPARTMENT_OTHER): Payer: Self-pay

## 2022-02-07 ENCOUNTER — Encounter: Payer: Self-pay | Admitting: Internal Medicine

## 2022-02-09 ENCOUNTER — Other Ambulatory Visit (HOSPITAL_BASED_OUTPATIENT_CLINIC_OR_DEPARTMENT_OTHER): Payer: Self-pay

## 2022-02-23 ENCOUNTER — Other Ambulatory Visit (HOSPITAL_BASED_OUTPATIENT_CLINIC_OR_DEPARTMENT_OTHER): Payer: Self-pay

## 2022-02-23 MED ORDER — FLUARIX QUADRIVALENT 0.5 ML IM SUSY
PREFILLED_SYRINGE | INTRAMUSCULAR | 0 refills | Status: AC
  Filled 2022-02-23: qty 0.5, 1d supply, fill #0

## 2022-03-17 ENCOUNTER — Other Ambulatory Visit (HOSPITAL_BASED_OUTPATIENT_CLINIC_OR_DEPARTMENT_OTHER): Payer: Self-pay

## 2022-04-20 ENCOUNTER — Other Ambulatory Visit (HOSPITAL_BASED_OUTPATIENT_CLINIC_OR_DEPARTMENT_OTHER): Payer: Self-pay

## 2022-05-04 ENCOUNTER — Other Ambulatory Visit (HOSPITAL_BASED_OUTPATIENT_CLINIC_OR_DEPARTMENT_OTHER): Payer: Self-pay

## 2022-06-14 ENCOUNTER — Other Ambulatory Visit (HOSPITAL_BASED_OUTPATIENT_CLINIC_OR_DEPARTMENT_OTHER): Payer: Self-pay

## 2022-06-23 ENCOUNTER — Other Ambulatory Visit (HOSPITAL_BASED_OUTPATIENT_CLINIC_OR_DEPARTMENT_OTHER): Payer: Self-pay

## 2022-06-23 MED ORDER — OMEPRAZOLE 20 MG PO CPDR
20.0000 mg | DELAYED_RELEASE_CAPSULE | Freq: Every day | ORAL | 3 refills | Status: DC
  Filled 2022-06-23 – 2022-07-26 (×2): qty 90, 90d supply, fill #0
  Filled 2022-10-31: qty 90, 90d supply, fill #1
  Filled 2023-01-09: qty 90, 90d supply, fill #2
  Filled 2023-05-18: qty 90, 90d supply, fill #3

## 2022-07-04 ENCOUNTER — Other Ambulatory Visit (HOSPITAL_BASED_OUTPATIENT_CLINIC_OR_DEPARTMENT_OTHER): Payer: Self-pay

## 2022-07-06 ENCOUNTER — Other Ambulatory Visit (HOSPITAL_BASED_OUTPATIENT_CLINIC_OR_DEPARTMENT_OTHER): Payer: Self-pay

## 2022-07-06 MED ORDER — DOXYCYCLINE HYCLATE 100 MG PO TABS
100.0000 mg | ORAL_TABLET | Freq: Two times a day (BID) | ORAL | 0 refills | Status: AC
  Filled 2022-07-06: qty 14, 7d supply, fill #0

## 2022-07-07 ENCOUNTER — Other Ambulatory Visit (HOSPITAL_BASED_OUTPATIENT_CLINIC_OR_DEPARTMENT_OTHER): Payer: Self-pay

## 2022-07-18 ENCOUNTER — Other Ambulatory Visit (HOSPITAL_BASED_OUTPATIENT_CLINIC_OR_DEPARTMENT_OTHER): Payer: Self-pay

## 2022-07-18 MED ORDER — BESIVANCE 0.6 % OP SUSP
1.0000 [drp] | Freq: Three times a day (TID) | OPHTHALMIC | 1 refills | Status: AC
  Filled 2022-07-18: qty 5, 34d supply, fill #0

## 2022-07-18 MED ORDER — PREDNISOLONE ACETATE 1 % OP SUSP
1.0000 [drp] | Freq: Four times a day (QID) | OPHTHALMIC | 1 refills | Status: AC
  Filled 2022-07-18: qty 5, 25d supply, fill #0

## 2022-07-19 ENCOUNTER — Other Ambulatory Visit (HOSPITAL_BASED_OUTPATIENT_CLINIC_OR_DEPARTMENT_OTHER): Payer: Self-pay

## 2022-07-20 ENCOUNTER — Other Ambulatory Visit (HOSPITAL_BASED_OUTPATIENT_CLINIC_OR_DEPARTMENT_OTHER): Payer: Self-pay

## 2022-07-20 MED ORDER — KETOROLAC TROMETHAMINE 0.5 % OP SOLN
1.0000 [drp] | Freq: Every day | OPHTHALMIC | 1 refills | Status: AC
  Filled 2022-07-20: qty 5, 90d supply, fill #0

## 2022-07-26 ENCOUNTER — Other Ambulatory Visit (HOSPITAL_BASED_OUTPATIENT_CLINIC_OR_DEPARTMENT_OTHER): Payer: Self-pay

## 2022-07-29 ENCOUNTER — Other Ambulatory Visit (HOSPITAL_BASED_OUTPATIENT_CLINIC_OR_DEPARTMENT_OTHER): Payer: Self-pay

## 2022-08-18 ENCOUNTER — Other Ambulatory Visit (HOSPITAL_BASED_OUTPATIENT_CLINIC_OR_DEPARTMENT_OTHER): Payer: Self-pay

## 2022-08-18 MED ORDER — DULOXETINE HCL 60 MG PO CPEP
60.0000 mg | ORAL_CAPSULE | Freq: Every day | ORAL | 3 refills | Status: AC
  Filled 2022-08-18: qty 90, 90d supply, fill #0
  Filled 2023-01-09: qty 90, 90d supply, fill #1

## 2022-09-07 ENCOUNTER — Other Ambulatory Visit (HOSPITAL_BASED_OUTPATIENT_CLINIC_OR_DEPARTMENT_OTHER): Payer: Self-pay

## 2022-09-15 DIAGNOSIS — H2512 Age-related nuclear cataract, left eye: Secondary | ICD-10-CM | POA: Diagnosis not present

## 2022-09-16 ENCOUNTER — Other Ambulatory Visit (HOSPITAL_BASED_OUTPATIENT_CLINIC_OR_DEPARTMENT_OTHER): Payer: Self-pay

## 2022-09-16 MED ORDER — BESIVANCE 0.6 % OP SUSP
1.0000 [drp] | Freq: Three times a day (TID) | OPHTHALMIC | 1 refills | Status: AC
  Filled 2022-09-16: qty 5, 34d supply, fill #0

## 2022-09-16 MED ORDER — PREDNISOLONE ACETATE 1 % OP SUSP
1.0000 [drp] | Freq: Four times a day (QID) | OPHTHALMIC | 1 refills | Status: AC
  Filled 2022-09-16: qty 5, 25d supply, fill #0

## 2022-09-19 ENCOUNTER — Other Ambulatory Visit (HOSPITAL_BASED_OUTPATIENT_CLINIC_OR_DEPARTMENT_OTHER): Payer: Self-pay

## 2022-10-19 ENCOUNTER — Other Ambulatory Visit (HOSPITAL_BASED_OUTPATIENT_CLINIC_OR_DEPARTMENT_OTHER): Payer: Self-pay

## 2022-10-31 ENCOUNTER — Other Ambulatory Visit (HOSPITAL_BASED_OUTPATIENT_CLINIC_OR_DEPARTMENT_OTHER): Payer: Self-pay

## 2022-11-10 ENCOUNTER — Other Ambulatory Visit (HOSPITAL_BASED_OUTPATIENT_CLINIC_OR_DEPARTMENT_OTHER): Payer: Self-pay

## 2022-11-10 MED ORDER — LISINOPRIL-HYDROCHLOROTHIAZIDE 10-12.5 MG PO TABS
1.0000 | ORAL_TABLET | Freq: Every day | ORAL | 6 refills | Status: AC
  Filled 2022-11-10: qty 90, 90d supply, fill #0
  Filled 2022-11-14: qty 30, 30d supply, fill #0
  Filled 2022-12-20: qty 90, 90d supply, fill #1
  Filled 2023-11-10: qty 90, 90d supply, fill #2

## 2022-11-14 ENCOUNTER — Other Ambulatory Visit (HOSPITAL_BASED_OUTPATIENT_CLINIC_OR_DEPARTMENT_OTHER): Payer: Self-pay

## 2022-11-16 ENCOUNTER — Other Ambulatory Visit (HOSPITAL_BASED_OUTPATIENT_CLINIC_OR_DEPARTMENT_OTHER): Payer: Self-pay

## 2022-12-20 ENCOUNTER — Other Ambulatory Visit (HOSPITAL_BASED_OUTPATIENT_CLINIC_OR_DEPARTMENT_OTHER): Payer: Self-pay

## 2023-01-09 ENCOUNTER — Other Ambulatory Visit (HOSPITAL_BASED_OUTPATIENT_CLINIC_OR_DEPARTMENT_OTHER): Payer: Self-pay

## 2023-03-03 DIAGNOSIS — H25811 Combined forms of age-related cataract, right eye: Secondary | ICD-10-CM | POA: Diagnosis not present

## 2023-03-03 DIAGNOSIS — Z01818 Encounter for other preprocedural examination: Secondary | ICD-10-CM | POA: Diagnosis not present

## 2023-03-10 ENCOUNTER — Other Ambulatory Visit (HOSPITAL_BASED_OUTPATIENT_CLINIC_OR_DEPARTMENT_OTHER): Payer: Self-pay

## 2023-03-13 ENCOUNTER — Other Ambulatory Visit (HOSPITAL_BASED_OUTPATIENT_CLINIC_OR_DEPARTMENT_OTHER): Payer: Self-pay

## 2023-03-13 MED ORDER — INFLUENZA VIRUS VACC SPLIT PF (FLUZONE) 0.5 ML IM SUSY
0.5000 mL | PREFILLED_SYRINGE | Freq: Once | INTRAMUSCULAR | 0 refills | Status: AC
  Filled 2023-03-13: qty 0.5, 1d supply, fill #0

## 2023-03-14 DIAGNOSIS — H25811 Combined forms of age-related cataract, right eye: Secondary | ICD-10-CM | POA: Diagnosis not present

## 2023-03-14 DIAGNOSIS — I10 Essential (primary) hypertension: Secondary | ICD-10-CM | POA: Diagnosis not present

## 2023-03-14 DIAGNOSIS — H259 Unspecified age-related cataract: Secondary | ICD-10-CM | POA: Diagnosis not present

## 2023-04-21 ENCOUNTER — Other Ambulatory Visit (HOSPITAL_BASED_OUTPATIENT_CLINIC_OR_DEPARTMENT_OTHER): Payer: Self-pay

## 2023-04-21 MED ORDER — DULOXETINE HCL 60 MG PO CPEP
60.0000 mg | ORAL_CAPSULE | Freq: Every day | ORAL | 3 refills | Status: DC
  Filled 2023-04-21: qty 90, 90d supply, fill #0
  Filled 2023-08-15: qty 90, 90d supply, fill #1
  Filled 2023-11-10: qty 90, 90d supply, fill #2
  Filled 2024-02-29: qty 90, 90d supply, fill #3

## 2023-04-21 MED ORDER — LISINOPRIL-HYDROCHLOROTHIAZIDE 10-12.5 MG PO TABS
1.0000 | ORAL_TABLET | Freq: Every day | ORAL | 6 refills | Status: AC
  Filled 2023-04-21: qty 90, 90d supply, fill #0
  Filled 2023-06-19 – 2023-07-03 (×3): qty 90, 90d supply, fill #1
  Filled 2023-08-23: qty 90, 90d supply, fill #2
  Filled 2024-01-19 – 2024-01-22 (×3): qty 90, 90d supply, fill #3

## 2023-04-26 ENCOUNTER — Other Ambulatory Visit (HOSPITAL_BASED_OUTPATIENT_CLINIC_OR_DEPARTMENT_OTHER): Payer: Self-pay

## 2023-05-18 ENCOUNTER — Other Ambulatory Visit (HOSPITAL_BASED_OUTPATIENT_CLINIC_OR_DEPARTMENT_OTHER): Payer: Self-pay

## 2023-06-08 ENCOUNTER — Other Ambulatory Visit (HOSPITAL_BASED_OUTPATIENT_CLINIC_OR_DEPARTMENT_OTHER): Payer: Self-pay

## 2023-06-08 MED ORDER — AZITHROMYCIN 250 MG PO TABS
ORAL_TABLET | ORAL | 0 refills | Status: AC
  Filled 2023-06-08: qty 6, 5d supply, fill #0

## 2023-06-08 MED ORDER — DOXEPIN HCL 10 MG/ML PO CONC
3.0000 mg | Freq: Every day | ORAL | 5 refills | Status: AC
  Filled 2023-06-08: qty 27, 90d supply, fill #0

## 2023-06-09 ENCOUNTER — Other Ambulatory Visit (HOSPITAL_BASED_OUTPATIENT_CLINIC_OR_DEPARTMENT_OTHER): Payer: Self-pay

## 2023-06-09 ENCOUNTER — Other Ambulatory Visit: Payer: Self-pay

## 2023-06-19 ENCOUNTER — Other Ambulatory Visit (HOSPITAL_BASED_OUTPATIENT_CLINIC_OR_DEPARTMENT_OTHER): Payer: Self-pay

## 2023-07-13 DIAGNOSIS — E785 Hyperlipidemia, unspecified: Secondary | ICD-10-CM | POA: Diagnosis not present

## 2023-07-13 DIAGNOSIS — I10 Essential (primary) hypertension: Secondary | ICD-10-CM | POA: Diagnosis not present

## 2023-07-13 DIAGNOSIS — Z1212 Encounter for screening for malignant neoplasm of rectum: Secondary | ICD-10-CM | POA: Diagnosis not present

## 2023-07-20 DIAGNOSIS — I10 Essential (primary) hypertension: Secondary | ICD-10-CM | POA: Diagnosis not present

## 2023-07-20 DIAGNOSIS — G4700 Insomnia, unspecified: Secondary | ICD-10-CM | POA: Diagnosis not present

## 2023-07-20 DIAGNOSIS — Z85820 Personal history of malignant melanoma of skin: Secondary | ICD-10-CM | POA: Diagnosis not present

## 2023-07-20 DIAGNOSIS — L989 Disorder of the skin and subcutaneous tissue, unspecified: Secondary | ICD-10-CM | POA: Diagnosis not present

## 2023-07-20 DIAGNOSIS — E785 Hyperlipidemia, unspecified: Secondary | ICD-10-CM | POA: Diagnosis not present

## 2023-07-20 DIAGNOSIS — R82998 Other abnormal findings in urine: Secondary | ICD-10-CM | POA: Diagnosis not present

## 2023-07-20 DIAGNOSIS — E669 Obesity, unspecified: Secondary | ICD-10-CM | POA: Diagnosis not present

## 2023-07-20 DIAGNOSIS — F900 Attention-deficit hyperactivity disorder, predominantly inattentive type: Secondary | ICD-10-CM | POA: Diagnosis not present

## 2023-07-20 DIAGNOSIS — Z0000 Encounter for general adult medical examination without abnormal findings: Secondary | ICD-10-CM | POA: Diagnosis not present

## 2023-07-20 DIAGNOSIS — M5416 Radiculopathy, lumbar region: Secondary | ICD-10-CM | POA: Diagnosis not present

## 2023-07-20 DIAGNOSIS — F329 Major depressive disorder, single episode, unspecified: Secondary | ICD-10-CM | POA: Diagnosis not present

## 2023-07-26 ENCOUNTER — Other Ambulatory Visit (HOSPITAL_BASED_OUTPATIENT_CLINIC_OR_DEPARTMENT_OTHER): Payer: Self-pay | Admitting: Internal Medicine

## 2023-07-26 DIAGNOSIS — Z0000 Encounter for general adult medical examination without abnormal findings: Secondary | ICD-10-CM

## 2023-08-01 DIAGNOSIS — L814 Other melanin hyperpigmentation: Secondary | ICD-10-CM | POA: Diagnosis not present

## 2023-08-01 DIAGNOSIS — D2272 Melanocytic nevi of left lower limb, including hip: Secondary | ICD-10-CM | POA: Diagnosis not present

## 2023-08-01 DIAGNOSIS — L820 Inflamed seborrheic keratosis: Secondary | ICD-10-CM | POA: Diagnosis not present

## 2023-08-01 DIAGNOSIS — D235 Other benign neoplasm of skin of trunk: Secondary | ICD-10-CM | POA: Diagnosis not present

## 2023-08-01 DIAGNOSIS — L821 Other seborrheic keratosis: Secondary | ICD-10-CM | POA: Diagnosis not present

## 2023-08-01 DIAGNOSIS — D0462 Carcinoma in situ of skin of left upper limb, including shoulder: Secondary | ICD-10-CM | POA: Diagnosis not present

## 2023-08-01 DIAGNOSIS — Z85820 Personal history of malignant melanoma of skin: Secondary | ICD-10-CM | POA: Diagnosis not present

## 2023-08-01 DIAGNOSIS — D1801 Hemangioma of skin and subcutaneous tissue: Secondary | ICD-10-CM | POA: Diagnosis not present

## 2023-08-15 ENCOUNTER — Other Ambulatory Visit (HOSPITAL_BASED_OUTPATIENT_CLINIC_OR_DEPARTMENT_OTHER): Payer: Self-pay

## 2023-08-15 ENCOUNTER — Ambulatory Visit (HOSPITAL_BASED_OUTPATIENT_CLINIC_OR_DEPARTMENT_OTHER)
Admission: RE | Admit: 2023-08-15 | Discharge: 2023-08-15 | Disposition: A | Discharge status: Home / Self Care | Payer: Self-pay | Source: Ambulatory Visit | Attending: Internal Medicine | Admitting: Internal Medicine

## 2023-08-15 ENCOUNTER — Encounter (HOSPITAL_BASED_OUTPATIENT_CLINIC_OR_DEPARTMENT_OTHER): Payer: Self-pay

## 2023-08-15 DIAGNOSIS — Z0000 Encounter for general adult medical examination without abnormal findings: Secondary | ICD-10-CM

## 2023-08-15 DIAGNOSIS — Z1231 Encounter for screening mammogram for malignant neoplasm of breast: Secondary | ICD-10-CM | POA: Insufficient documentation

## 2023-08-23 ENCOUNTER — Other Ambulatory Visit (HOSPITAL_BASED_OUTPATIENT_CLINIC_OR_DEPARTMENT_OTHER): Payer: Self-pay

## 2023-09-01 ENCOUNTER — Other Ambulatory Visit (HOSPITAL_BASED_OUTPATIENT_CLINIC_OR_DEPARTMENT_OTHER): Payer: Self-pay

## 2023-09-04 ENCOUNTER — Other Ambulatory Visit (HOSPITAL_BASED_OUTPATIENT_CLINIC_OR_DEPARTMENT_OTHER): Payer: Self-pay

## 2023-09-04 MED ORDER — OMEPRAZOLE 20 MG PO CPDR
20.0000 mg | DELAYED_RELEASE_CAPSULE | Freq: Every morning | ORAL | 3 refills | Status: AC
  Filled 2023-09-04: qty 90, 90d supply, fill #0
  Filled 2023-12-21: qty 90, 90d supply, fill #1
  Filled 2024-02-29 – 2024-03-04 (×2): qty 90, 90d supply, fill #2

## 2023-09-21 ENCOUNTER — Other Ambulatory Visit (HOSPITAL_BASED_OUTPATIENT_CLINIC_OR_DEPARTMENT_OTHER): Payer: Self-pay

## 2023-09-21 DIAGNOSIS — M545 Low back pain, unspecified: Secondary | ICD-10-CM | POA: Diagnosis not present

## 2023-09-21 DIAGNOSIS — M5416 Radiculopathy, lumbar region: Secondary | ICD-10-CM | POA: Diagnosis not present

## 2023-09-21 DIAGNOSIS — M47816 Spondylosis without myelopathy or radiculopathy, lumbar region: Secondary | ICD-10-CM | POA: Diagnosis not present

## 2023-09-21 MED ORDER — GABAPENTIN 300 MG PO CAPS
300.0000 mg | ORAL_CAPSULE | Freq: Every day | ORAL | 0 refills | Status: AC
  Filled 2023-09-21: qty 30, 30d supply, fill #0

## 2023-10-11 ENCOUNTER — Encounter: Payer: Self-pay | Admitting: Internal Medicine

## 2023-10-12 ENCOUNTER — Other Ambulatory Visit (HOSPITAL_BASED_OUTPATIENT_CLINIC_OR_DEPARTMENT_OTHER): Payer: Self-pay | Admitting: Anesthesiology

## 2023-10-12 DIAGNOSIS — M545 Low back pain, unspecified: Secondary | ICD-10-CM

## 2023-10-12 DIAGNOSIS — M5416 Radiculopathy, lumbar region: Secondary | ICD-10-CM

## 2023-10-13 ENCOUNTER — Other Ambulatory Visit (HOSPITAL_BASED_OUTPATIENT_CLINIC_OR_DEPARTMENT_OTHER): Payer: Self-pay

## 2023-10-13 MED ORDER — CYCLOBENZAPRINE HCL 5 MG PO TABS
5.0000 mg | ORAL_TABLET | Freq: Two times a day (BID) | ORAL | 0 refills | Status: DC | PRN
  Filled 2023-10-13: qty 30, 15d supply, fill #0

## 2023-10-14 ENCOUNTER — Ambulatory Visit

## 2023-10-14 DIAGNOSIS — M545 Low back pain, unspecified: Secondary | ICD-10-CM

## 2023-10-14 DIAGNOSIS — M48061 Spinal stenosis, lumbar region without neurogenic claudication: Secondary | ICD-10-CM | POA: Diagnosis not present

## 2023-10-14 DIAGNOSIS — M5125 Other intervertebral disc displacement, thoracolumbar region: Secondary | ICD-10-CM | POA: Diagnosis not present

## 2023-10-14 DIAGNOSIS — M5416 Radiculopathy, lumbar region: Secondary | ICD-10-CM

## 2023-10-14 DIAGNOSIS — M5126 Other intervertebral disc displacement, lumbar region: Secondary | ICD-10-CM | POA: Diagnosis not present

## 2023-10-14 DIAGNOSIS — M51379 Other intervertebral disc degeneration, lumbosacral region without mention of lumbar back pain or lower extremity pain: Secondary | ICD-10-CM | POA: Diagnosis not present

## 2023-10-16 ENCOUNTER — Other Ambulatory Visit: Payer: Self-pay

## 2023-10-16 ENCOUNTER — Other Ambulatory Visit (HOSPITAL_BASED_OUTPATIENT_CLINIC_OR_DEPARTMENT_OTHER): Payer: Self-pay

## 2023-10-16 MED ORDER — PREGABALIN 25 MG PO CAPS
25.0000 mg | ORAL_CAPSULE | Freq: Two times a day (BID) | ORAL | 1 refills | Status: AC
  Filled 2023-10-16 (×2): qty 60, 30d supply, fill #0
  Filled 2023-11-28: qty 60, 30d supply, fill #1

## 2023-10-30 ENCOUNTER — Other Ambulatory Visit (HOSPITAL_BASED_OUTPATIENT_CLINIC_OR_DEPARTMENT_OTHER): Payer: Self-pay

## 2023-11-02 ENCOUNTER — Inpatient Hospital Stay (HOSPITAL_BASED_OUTPATIENT_CLINIC_OR_DEPARTMENT_OTHER)
Admission: RE | Admit: 2023-11-02 | Discharge: 2023-11-02 | Disposition: A | Discharge status: Home / Self Care | Source: Ambulatory Visit | Attending: Anesthesiology | Admitting: Anesthesiology

## 2023-11-10 ENCOUNTER — Other Ambulatory Visit (HOSPITAL_BASED_OUTPATIENT_CLINIC_OR_DEPARTMENT_OTHER): Payer: Self-pay

## 2023-11-16 ENCOUNTER — Encounter: Payer: Self-pay | Admitting: Gastroenterology

## 2023-11-28 ENCOUNTER — Other Ambulatory Visit (HOSPITAL_BASED_OUTPATIENT_CLINIC_OR_DEPARTMENT_OTHER): Payer: Self-pay

## 2023-11-28 ENCOUNTER — Other Ambulatory Visit: Payer: Self-pay

## 2023-12-21 ENCOUNTER — Other Ambulatory Visit (HOSPITAL_BASED_OUTPATIENT_CLINIC_OR_DEPARTMENT_OTHER): Payer: Self-pay

## 2024-01-02 ENCOUNTER — Encounter

## 2024-01-02 ENCOUNTER — Telehealth: Payer: Self-pay

## 2024-01-02 NOTE — Telephone Encounter (Signed)
 Mutiple attempts made to complete PV. Unable to reach patient. VM left. Pt to call the office back by 5 PM to have PV rescheduled. Pt made aware that in the event that we do not hear back from them their PV and scheduled procedure will be cancelled.

## 2024-01-19 ENCOUNTER — Other Ambulatory Visit (HOSPITAL_BASED_OUTPATIENT_CLINIC_OR_DEPARTMENT_OTHER): Payer: Self-pay

## 2024-01-23 ENCOUNTER — Encounter: Admitting: Gastroenterology

## 2024-01-30 ENCOUNTER — Other Ambulatory Visit (HOSPITAL_BASED_OUTPATIENT_CLINIC_OR_DEPARTMENT_OTHER): Payer: Self-pay

## 2024-01-30 ENCOUNTER — Other Ambulatory Visit (HOSPITAL_COMMUNITY): Payer: Self-pay

## 2024-01-30 DIAGNOSIS — M5416 Radiculopathy, lumbar region: Secondary | ICD-10-CM | POA: Diagnosis not present

## 2024-01-30 MED ORDER — PREGABALIN 50 MG PO CAPS
50.0000 mg | ORAL_CAPSULE | Freq: Two times a day (BID) | ORAL | 1 refills | Status: AC
  Filled 2024-01-30: qty 60, 30d supply, fill #0

## 2024-01-30 MED ORDER — CYCLOBENZAPRINE HCL 5 MG PO TABS
5.0000 mg | ORAL_TABLET | Freq: Two times a day (BID) | ORAL | 0 refills | Status: DC | PRN
  Filled 2024-01-30: qty 30, 15d supply, fill #0

## 2024-02-05 DIAGNOSIS — I10 Essential (primary) hypertension: Secondary | ICD-10-CM | POA: Diagnosis not present

## 2024-02-05 DIAGNOSIS — R600 Localized edema: Secondary | ICD-10-CM | POA: Diagnosis not present

## 2024-02-07 ENCOUNTER — Other Ambulatory Visit (HOSPITAL_BASED_OUTPATIENT_CLINIC_OR_DEPARTMENT_OTHER): Payer: Self-pay

## 2024-02-07 DIAGNOSIS — R600 Localized edema: Secondary | ICD-10-CM | POA: Diagnosis not present

## 2024-02-07 DIAGNOSIS — I10 Essential (primary) hypertension: Secondary | ICD-10-CM | POA: Diagnosis not present

## 2024-02-07 DIAGNOSIS — M5416 Radiculopathy, lumbar region: Secondary | ICD-10-CM | POA: Diagnosis not present

## 2024-02-07 MED ORDER — LISINOPRIL 10 MG PO TABS
10.0000 mg | ORAL_TABLET | Freq: Every day | ORAL | 0 refills | Status: DC
  Filled 2024-02-07: qty 90, 90d supply, fill #0

## 2024-02-07 MED ORDER — HYDROCHLOROTHIAZIDE 25 MG PO TABS
25.0000 mg | ORAL_TABLET | Freq: Every morning | ORAL | 0 refills | Status: DC
  Filled 2024-02-07: qty 90, 90d supply, fill #0

## 2024-02-21 ENCOUNTER — Other Ambulatory Visit (HOSPITAL_BASED_OUTPATIENT_CLINIC_OR_DEPARTMENT_OTHER): Payer: Self-pay

## 2024-02-21 MED ORDER — TRAMADOL HCL 50 MG PO TABS
50.0000 mg | ORAL_TABLET | Freq: Three times a day (TID) | ORAL | 0 refills | Status: AC | PRN
  Filled 2024-02-21: qty 21, 7d supply, fill #0

## 2024-02-28 DIAGNOSIS — M5416 Radiculopathy, lumbar region: Secondary | ICD-10-CM | POA: Diagnosis not present

## 2024-02-29 ENCOUNTER — Other Ambulatory Visit (HOSPITAL_BASED_OUTPATIENT_CLINIC_OR_DEPARTMENT_OTHER): Payer: Self-pay

## 2024-03-13 ENCOUNTER — Other Ambulatory Visit (HOSPITAL_BASED_OUTPATIENT_CLINIC_OR_DEPARTMENT_OTHER): Payer: Self-pay

## 2024-03-13 MED ORDER — CIPROFLOXACIN HCL 500 MG PO TABS
500.0000 mg | ORAL_TABLET | Freq: Two times a day (BID) | ORAL | 0 refills | Status: AC
  Filled 2024-03-13: qty 10, 5d supply, fill #0

## 2024-03-13 MED ORDER — FLUZONE 0.5 ML IM SUSY
0.5000 mL | PREFILLED_SYRINGE | Freq: Once | INTRAMUSCULAR | 0 refills | Status: AC
  Filled 2024-03-13: qty 0.5, 1d supply, fill #0

## 2024-03-26 ENCOUNTER — Other Ambulatory Visit (HOSPITAL_BASED_OUTPATIENT_CLINIC_OR_DEPARTMENT_OTHER): Payer: Self-pay

## 2024-03-26 MED ORDER — PREDNISONE 10 MG (21) PO TBPK
ORAL_TABLET | ORAL | 0 refills | Status: AC
  Filled 2024-03-26: qty 21, 6d supply, fill #0

## 2024-04-10 ENCOUNTER — Emergency Department (HOSPITAL_BASED_OUTPATIENT_CLINIC_OR_DEPARTMENT_OTHER)
Admission: EM | Admit: 2024-04-10 | Discharge: 2024-04-10 | Disposition: A | Discharge status: Home / Self Care | Attending: Emergency Medicine | Admitting: Emergency Medicine

## 2024-04-10 ENCOUNTER — Other Ambulatory Visit (HOSPITAL_BASED_OUTPATIENT_CLINIC_OR_DEPARTMENT_OTHER): Payer: Self-pay

## 2024-04-10 ENCOUNTER — Other Ambulatory Visit: Payer: Self-pay

## 2024-04-10 ENCOUNTER — Encounter (HOSPITAL_BASED_OUTPATIENT_CLINIC_OR_DEPARTMENT_OTHER): Payer: Self-pay

## 2024-04-10 DIAGNOSIS — M5442 Lumbago with sciatica, left side: Secondary | ICD-10-CM | POA: Diagnosis not present

## 2024-04-10 DIAGNOSIS — M545 Low back pain, unspecified: Secondary | ICD-10-CM | POA: Diagnosis present

## 2024-04-10 MED ORDER — METHYLPREDNISOLONE 4 MG PO TBPK
ORAL_TABLET | ORAL | 0 refills | Status: AC
  Filled 2024-04-10: qty 21, 6d supply, fill #0

## 2024-04-10 MED ORDER — OXYCODONE HCL 5 MG PO TABS
5.0000 mg | ORAL_TABLET | Freq: Once | ORAL | Status: AC
Start: 2024-04-10 — End: 2024-04-10
  Administered 2024-04-10: 5 mg via ORAL
  Filled 2024-04-10: qty 1

## 2024-04-10 MED ORDER — OXYCODONE HCL 5 MG PO TABS
5.0000 mg | ORAL_TABLET | Freq: Once | ORAL | Status: AC
  Administered 2024-04-10: 5 mg via ORAL
  Filled 2024-04-10: qty 1

## 2024-04-10 MED ORDER — CYCLOBENZAPRINE HCL 10 MG PO TABS
10.0000 mg | ORAL_TABLET | Freq: Two times a day (BID) | ORAL | 0 refills | Status: AC | PRN
  Filled 2024-04-10: qty 20, 10d supply, fill #0

## 2024-04-10 MED ORDER — DIAZEPAM 5 MG/ML IJ SOLN
2.5000 mg | Freq: Once | INTRAMUSCULAR | Status: AC
  Administered 2024-04-10: 2.5 mg via INTRAVENOUS
  Filled 2024-04-10: qty 2

## 2024-04-10 MED ORDER — OXYCODONE HCL 5 MG PO TABS
5.0000 mg | ORAL_TABLET | Freq: Four times a day (QID) | ORAL | 0 refills | Status: AC | PRN
  Filled 2024-04-10: qty 10, 3d supply, fill #0

## 2024-04-10 MED ORDER — KETOROLAC TROMETHAMINE 15 MG/ML IJ SOLN
15.0000 mg | Freq: Once | INTRAMUSCULAR | Status: AC
  Administered 2024-04-10: 15 mg via INTRAVENOUS
  Filled 2024-04-10: qty 1

## 2024-04-10 MED ORDER — DEXAMETHASONE SOD PHOSPHATE PF 10 MG/ML IJ SOLN
10.0000 mg | Freq: Once | INTRAMUSCULAR | Status: AC
  Administered 2024-04-10: 10 mg via INTRAVENOUS

## 2024-04-10 MED ORDER — LIDOCAINE 5 % EX PTCH
1.0000 | MEDICATED_PATCH | CUTANEOUS | Status: DC
  Administered 2024-04-10: 1 via TRANSDERMAL
  Filled 2024-04-10: qty 1

## 2024-04-10 NOTE — ED Provider Notes (Signed)
 [facility] Provider Note   CSN: [id number] Arrival date & time: 04/10/24  1358     Patient presents with: Back Pain   Rebecca Caldwell is a 63 y.o. female.   Patient here left lower back pain radiates into her left thigh.  Ongoing for the last several months on and off.  MRI recently showed herniated disc.  She has had injections in her back supposed to see spine surgery next week.  She has no loss of bowel or bladder.  No weakness.  Pain is worse with flexion at the hip.  Patient has history of reflux high cholesterol.  She has been taking muscle relaxant and steroids intermittently with some relief as well.  Takes NSAIDs as well.  She has been taking tramadol  at times without much improvement either.  The history is provided by the patient.       Prior to Admission medications   Medication Sig Start Date End Date Taking? Authorizing Provider  cyclobenzaprine  (FLEXERIL ) 10 MG tablet Take 1 tablet (10 mg total) by mouth 2 (two) times daily as needed for muscle spasms. 04/10/24  Yes Kert Shackett, DO  methylPREDNISolone  (MEDROL  DOSEPAK) 4 MG TBPK tablet Take as directed per package instructions. 04/10/24  Yes Zeddie Njie, DO  oxyCODONE  (ROXICODONE ) 5 MG immediate release tablet Take 1 tablet (5 mg total) by mouth every 6 (six) hours as needed for up to 10 doses. 04/10/24  Yes Vermell Madrid, DO  amphetamine-dextroamphetamine (ADDERALL) 10 MG tablet Take 10 mg by mouth daily. 07/04/18   [provider]  azithromycin  (ZITHROMAX ) 250 MG tablet Take 2 tablets first day and then one tablet daily for the next four days. 06/08/23     BESIVANCE  0.6 % SUSP Place 1 drop into the left eye 3 (three) times daily as directed 07/18/22     BESIVANCE  0.6 % SUSP Place 1 drop into the right eye 3 (three) times daily. 09/16/22     ciprofloxacin  (CIPRO ) 500 MG tablet Take 1 tablet Orally every 12 hrs 7 days 12/24/20     ciprofloxacin  (CIPRO ) 500 MG tablet  Take 1 tablet (500 mg total) by mouth in the morning and at bedtime. 03/13/24   Long, Fonda MATSU, MD  COVID-19 At Home Antigen Test [facility] COVID-19 HOME TEST) KIT USE AS DIRECTED WITHIN PACKAGE INSTRUCTIONS 10/14/20   Delores Morene BROCKS, [facility]  COVID-19 At Home Antigen Test [facility] COVID-19 HOME TEST) KIT Use as directed per package instructions 06/28/21   Delores Morene BROCKS, [facility]  doxepin  (SINEQUAN ) 10 MG/ML solution Take 0.3 mLs (3 mg total) by mouth at bedtime as needed for sleep. 06/08/23     doxycycline  (VIBRA -TABS) 100 MG tablet Take 1 tablet (100 mg total) by mouth 2 (two) times daily for 7 days. 07/06/22   Dean Clarity, MD  DULoxetine  (CYMBALTA ) 60 MG capsule Take by mouth.    [provider]  DULoxetine  (CYMBALTA ) 60 MG capsule TAKE 1 CAPSULE BY MOUTH EVERY DAY 03/02/21     DULoxetine  (CYMBALTA ) 60 MG capsule Take 1 capsule by mouth once daily. **Need appointment for further refills** 06/25/21     DULoxetine  (CYMBALTA ) 60 MG capsule Take 1 capsule by mouth once a day 08/03/21     DULoxetine  (CYMBALTA ) 60 MG capsule Take 1 capsule by mouth once daily 09/07/21     DULoxetine  (CYMBALTA ) 60 MG capsule Take 1 capsule (60 mg total) by mouth daily. 08/18/22     DULoxetine  (CYMBALTA ) 60 MG  capsule Take 1 capsule (60 mg total) by mouth daily. 04/21/23     gabapentin  (NEURONTIN ) 300 MG capsule Take 1 capsule (300 mg total) by mouth at bedtime. 09/21/23     hydrochlorothiazide  (HYDRODIURIL ) 25 MG tablet Take 1 tablet (25 mg total) by mouth in the morning. 02/07/24     HYDROcodone-acetaminophen (NORCO/VICODIN) 5-325 MG tablet Take 1 tablet by mouth every 4 (four) hours as needed. 05/27/19   [provider]  influenza vac split quadrivalent PF (FLUARIX  QUADRIVALENT) 0.5 ML injection Inject into the muscle. 02/23/22   Luiz Channel, MD  ketorolac  (ACULAR ) 0.5 % ophthalmic solution Place 1 drop into the left eye at bedtime. 07/18/22   Austin Olam CROME, MD  lisinopril  (ZESTRIL ) 10 MG tablet Take 1 tablet  (10 mg total) by mouth daily. 02/07/24     lisinopril -hydrochlorothiazide  (ZESTORETIC ) 10-12.5 MG tablet  10/01/18   [provider]  lisinopril -hydrochlorothiazide  (ZESTORETIC ) 10-12.5 MG tablet TAKE 1 TABLET BY MOUTH EVERY DAY 03/02/21     lisinopril -hydrochlorothiazide  (ZESTORETIC ) 10-12.5 MG tablet Take 1 tablet by mouth once daily **Need appointment for further refills** 06/25/21     lisinopril -hydrochlorothiazide  (ZESTORETIC ) 10-12.5 MG tablet Take 1 tablet by mouth once a day 08/03/21     lisinopril -hydrochlorothiazide  (ZESTORETIC ) 10-12.5 MG tablet Take 1 tablet by mouth once daily 09/07/21     lisinopril -hydrochlorothiazide  (ZESTORETIC ) 10-12.5 MG tablet Take 1 tablet by mouth daily. 11/10/22     lisinopril -hydrochlorothiazide  (ZESTORETIC ) 10-12.5 MG tablet Take 1 tablet by mouth daily as directed. 04/21/23     morphine  (MSIR) 15 MG tablet Take 0.5 tablets (7.5 mg total) by mouth every 4 (four) hours as needed for severe pain. 09/10/19   Harris, Abigail, PA-C  nitrofurantoin , macrocrystal-monohydrate, (MACROBID ) 100 MG capsule Take 1 capsule by mouth twice a day for 7 days 12/16/20     omeprazole  (PRILOSEC) 20 MG capsule Take 20 mg by mouth daily.    [provider]  omeprazole  (PRILOSEC) 20 MG capsule Take 1 capsule (20 mg total) by mouth 30 minutes before morning meal. 09/03/23     prednisoLONE  acetate (PRED FORTE ) 1 % ophthalmic suspension Place 1 drop into the left eye 4 (four) times daily  as directed. 07/18/22     prednisoLONE  acetate (PRED FORTE ) 1 % ophthalmic suspension Place 1 drop into the right eye 4 (four) times daily as directed. 09/16/22   Austin Olam CROME, MD  predniSONE  (STERAPRED UNI-PAK 21 TAB) 10 MG (21) TBPK tablet Take by mouth daily. Follow package insert 02/03/21   Shepard Clinch, PA-C  predniSONE  (STERAPRED UNI-PAK 21 TAB) 10 MG (21) TBPK tablet Take as directed per package instructions for 6 days. 03/26/24     pregabalin  (LYRICA ) 25 MG capsule Take 1 capsule (25 mg  total) by mouth 2 (two) times daily. 10/16/23     pregabalin  (LYRICA ) 50 MG capsule Take 1 capsule (50 mg total) by mouth 2 (two) times daily. 01/30/24     terbinafine  (LAMISIL ) 250 MG tablet Take 1 tablet (250 mg total) by mouth daily. 10/12/18   Magdalen Pasco RAMAN, DPM    Allergies: Penicillins, Percocet [oxycodone -acetaminophen], Nsaids, and Tolmetin    Review of Systems  Updated Vital Signs BP 132/68 (BP Location: Left Arm)   Pulse 85   Temp 98 F (36.7 C) (Oral)   Resp 20   SpO2 100%   Physical Exam Vitals and nursing note reviewed.  Constitutional:      General: She is not in acute distress.    Appearance: She  is well-developed. She is not ill-appearing.  HENT:     Head: Normocephalic and atraumatic.     Nose: Nose normal.     Mouth/Throat:     Mouth: Mucous membranes are moist.  Eyes:     Extraocular Movements: Extraocular movements intact.     Conjunctiva/sclera: Conjunctivae normal.     Pupils: Pupils are equal, round, and reactive to light.  Cardiovascular:     Rate and Rhythm: Normal rate and regular rhythm.     Pulses: Normal pulses.     Heart sounds: Normal heart sounds. No murmur heard. Pulmonary:     Effort: Pulmonary effort is normal. No respiratory distress.     Breath sounds: Normal breath sounds.  Abdominal:     Palpations: Abdomen is soft.     Tenderness: There is no abdominal tenderness.  Musculoskeletal:        General: Tenderness present. No swelling.     Cervical back: Normal range of motion and neck supple.     Comments: Tenderness to the left lower back, no midline spinal tenderness  Skin:    General: Skin is warm and dry.     Capillary Refill: Capillary refill takes less than 2 seconds.  Neurological:     General: No focal deficit present.     Mental Status: She is alert and oriented to person, place, and time.     Cranial Nerves: No cranial nerve deficit.     Sensory: No sensory deficit.     Motor: No weakness.     Coordination: Coordination  normal.     Comments: 5+ out of 5 strength, normal sensation  Psychiatric:        Mood and Affect: Mood normal.     (all labs ordered are listed, but only abnormal results are displayed) Labs Reviewed - No data to display  EKG: None  Radiology: No results found.   Procedures   Medications Ordered in the ED  lidocaine  (LIDODERM ) 5 % 1 patch (1 patch Transdermal Patch Applied 04/10/24 1427)  oxyCODONE  (Oxy IR/ROXICODONE ) immediate release tablet 5 mg (has no administration in time range)  diazepam  (VALIUM ) injection 2.5 mg (2.5 mg Intravenous Given 04/10/24 1421)  ketorolac  (TORADOL ) 15 MG/ML injection 15 mg (15 mg Intravenous Given 04/10/24 1422)  dexamethasone  (DECADRON ) injection 10 mg (10 mg Intravenous Given 04/10/24 1422)                                    Medical Decision Making Risk Prescription drug management.   Rebecca Caldwell is here with left lower back pain radiating into the left thigh.  History of herniated disc she has been intermittently on steroids muscle relaxant and tramadol  had spinal injections with minimal help here recently.  She supposed to see a neurosurgeon next week for the same.  She has no cauda equina symptoms on exam.  No loss of bowel or bladder.  Normal vitals.  No fever.  Have no concern for infectious process.  She is neurovascular neuromuscular intact overall.  Will give her a dose of Valium  Toradol  and Decadron  here in the ED and reevaluate.  Overall I do think symptoms likely secondary from herniated disc/radicular process.  I have no concern for infectious process or cauda equina or any need for any emergent workup otherwise.  Overall she is feeling improved.  Anticipate discharge.  Will prescribe Flexeril  and Medrol  Dosepak and Roxicodone  for breakthrough  pain.  Sounds like she has tolerated narcotics in the past by chart.  She has follow-up with neurosurgery next week.  She understands return if symptoms worsen.  This chart was dictated  using voice recognition software.  Despite best efforts to proofread,  errors can occur which can change the documentation meaning.      Final diagnoses:  Acute left-sided low back pain with left-sided sciatica    ED Discharge Orders          Ordered    methylPREDNISolone  (MEDROL  DOSEPAK) 4 MG TBPK tablet        04/10/24 1422    cyclobenzaprine  (FLEXERIL ) 10 MG tablet  2 times daily PRN        04/10/24 1422    oxyCODONE  (ROXICODONE ) 5 MG immediate release tablet  Every 6 hours PRN        04/10/24 1441               Ruthe Cornet, DO 04/10/24 1446

## 2024-04-10 NOTE — ED Triage Notes (Signed)
 Pt reports left sided back pain that radiates to left leg x  1 week. Endorses nausea & numbness to left leg

## 2024-04-10 NOTE — ED Notes (Signed)
 Pt alert and oriented X 4 at the time of discharge. RR even and unlabored. No acute distress noted. Pt verbalized understanding of discharge instructions as discussed. Pt ambulatory to lobby at time of discharge.

## 2024-04-10 NOTE — Discharge Instructions (Addendum)
 Follow-up with your neurosurgery team.  The oxycodone  and Flexeril  are both sedating medication so please be careful with this use.  Do not mix alcohol drugs or dangerous activities including driving.  Continue 1000 mg of Tylenol every 6 hours as needed for pain.  Take next dose of methylprednisolone  tomorrow.

## 2024-04-10 NOTE — ED Provider Notes (Signed)
  Physical Exam  BP 137/77   Pulse 84   Temp 98 F (36.7 C) (Oral)   Resp 20   SpO2 94%   Physical Exam Vitals and nursing note reviewed.  Constitutional:      General: She is not in acute distress.    Appearance: She is well-developed.  HENT:     Head: Normocephalic and atraumatic.  Eyes:     Conjunctiva/sclera: Conjunctivae normal.  Cardiovascular:     Rate and Rhythm: Normal rate and regular rhythm.     Heart sounds: No murmur heard. Pulmonary:     Effort: Pulmonary effort is normal. No respiratory distress.     Breath sounds: Normal breath sounds.  Abdominal:     Palpations: Abdomen is soft.     Tenderness: There is no abdominal tenderness.  Musculoskeletal:        General: No swelling.     Cervical back: Neck supple.  Skin:    General: Skin is warm and dry.     Capillary Refill: Capillary refill takes less than 2 seconds.  Neurological:     Mental Status: She is alert.  Psychiatric:        Mood and Affect: Mood normal.     Procedures  Procedures  ED Course / MDM    Medical Decision Making Risk Prescription drug management.   Received in signout.  Presents with back pain.  MRI recently showed herniated disc.  Will see spine surgery next week.  No loss of bowel bladder continence.  No weakness.  No fever no chills.  Addressing patient's pain here.  Was able to get more comfortable.  Will reassess.   Symptoms feeling better. No red flag. Discharged in stable condition.       Simon Lavonia SAILOR, MD 04/10/24 [phone]

## 2024-04-18 DIAGNOSIS — Z6838 Body mass index (BMI) 38.0-38.9, adult: Secondary | ICD-10-CM | POA: Diagnosis not present

## 2024-04-18 DIAGNOSIS — M25552 Pain in left hip: Secondary | ICD-10-CM | POA: Diagnosis not present

## 2024-04-19 ENCOUNTER — Other Ambulatory Visit (HOSPITAL_BASED_OUTPATIENT_CLINIC_OR_DEPARTMENT_OTHER): Payer: Self-pay | Admitting: Neurological Surgery

## 2024-04-19 DIAGNOSIS — M25552 Pain in left hip: Secondary | ICD-10-CM

## 2024-04-25 ENCOUNTER — Ambulatory Visit (INDEPENDENT_AMBULATORY_CARE_PROVIDER_SITE_OTHER)
Admission: RE | Admit: 2024-04-25 | Discharge: 2024-04-25 | Disposition: A | Discharge status: Home / Self Care | Source: Ambulatory Visit | Attending: Neurological Surgery | Admitting: Neurological Surgery

## 2024-04-25 DIAGNOSIS — M25552 Pain in left hip: Secondary | ICD-10-CM

## 2024-04-25 DIAGNOSIS — M160 Bilateral primary osteoarthritis of hip: Secondary | ICD-10-CM | POA: Diagnosis not present

## 2024-04-25 DIAGNOSIS — M25452 Effusion, left hip: Secondary | ICD-10-CM | POA: Diagnosis not present

## 2024-04-30 ENCOUNTER — Other Ambulatory Visit (HOSPITAL_BASED_OUTPATIENT_CLINIC_OR_DEPARTMENT_OTHER): Payer: Self-pay

## 2024-04-30 MED ORDER — TRAMADOL HCL 50 MG PO TABS
50.0000 mg | ORAL_TABLET | Freq: Four times a day (QID) | ORAL | 0 refills | Status: AC | PRN
  Filled 2024-04-30: qty 30, 8d supply, fill #0

## 2024-05-02 DIAGNOSIS — M1612 Unilateral primary osteoarthritis, left hip: Secondary | ICD-10-CM | POA: Diagnosis not present

## 2024-05-07 ENCOUNTER — Other Ambulatory Visit (HOSPITAL_BASED_OUTPATIENT_CLINIC_OR_DEPARTMENT_OTHER): Payer: Self-pay

## 2024-05-07 ENCOUNTER — Other Ambulatory Visit: Payer: Self-pay

## 2024-05-07 MED ORDER — PREGABALIN 75 MG PO CAPS
ORAL_CAPSULE | ORAL | 0 refills | Status: AC
  Filled 2024-05-07: qty 90, 37d supply, fill #0

## 2024-05-08 ENCOUNTER — Other Ambulatory Visit (HOSPITAL_BASED_OUTPATIENT_CLINIC_OR_DEPARTMENT_OTHER): Payer: Self-pay

## 2024-05-10 ENCOUNTER — Other Ambulatory Visit (HOSPITAL_BASED_OUTPATIENT_CLINIC_OR_DEPARTMENT_OTHER): Payer: Self-pay

## 2024-05-13 ENCOUNTER — Other Ambulatory Visit (HOSPITAL_BASED_OUTPATIENT_CLINIC_OR_DEPARTMENT_OTHER): Payer: Self-pay

## 2024-05-13 ENCOUNTER — Other Ambulatory Visit: Payer: Self-pay

## 2024-05-13 MED ORDER — HYDROCHLOROTHIAZIDE 25 MG PO TABS
25.0000 mg | ORAL_TABLET | Freq: Every morning | ORAL | 0 refills | Status: AC
  Filled 2024-05-13: qty 90, 90d supply, fill #0

## 2024-05-13 MED ORDER — LISINOPRIL 10 MG PO TABS
10.0000 mg | ORAL_TABLET | Freq: Every day | ORAL | 0 refills | Status: AC
  Filled 2024-05-13: qty 90, 90d supply, fill #0

## 2024-05-31 ENCOUNTER — Other Ambulatory Visit (HOSPITAL_BASED_OUTPATIENT_CLINIC_OR_DEPARTMENT_OTHER): Payer: Self-pay

## 2024-05-31 MED ORDER — DULOXETINE HCL 60 MG PO CPEP
60.0000 mg | ORAL_CAPSULE | Freq: Every day | ORAL | 0 refills | Status: AC
  Filled 2024-05-31: qty 90, 90d supply, fill #0

## 2024-06-21 ENCOUNTER — Encounter: Admitting: Obstetrics and Gynecology
